# Patient Record
Sex: Male | Born: 1985
Health system: Southern US, Community
[De-identification: ages and names within clinical notes are randomized; demographics above are authoritative.]

## PROBLEM LIST (undated history)

## (undated) ENCOUNTER — Emergency Department (HOSPITAL_COMMUNITY): Disposition: A | Discharge status: Home / Self Care | Payer: Self-pay

## (undated) DIAGNOSIS — Z8719 Personal history of other diseases of the digestive system: Secondary | ICD-10-CM

## (undated) DIAGNOSIS — J45909 Unspecified asthma, uncomplicated: Secondary | ICD-10-CM

---
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
## 2009-11-28 ENCOUNTER — Emergency Department (HOSPITAL_COMMUNITY): Admission: EM | Admit: 2009-11-28 | Discharge: 2009-11-28 | Payer: Self-pay | Admitting: Emergency Medicine

## 2010-04-22 ENCOUNTER — Emergency Department (HOSPITAL_COMMUNITY): Admission: EM | Admit: 2010-04-22 | Discharge: 2010-04-22 | Payer: Self-pay | Admitting: Emergency Medicine

## 2013-01-11 ENCOUNTER — Emergency Department (HOSPITAL_COMMUNITY)
Admission: EM | Admit: 2013-01-11 | Discharge: 2013-01-11 | Disposition: A | Discharge status: Home / Self Care | Payer: Self-pay | Attending: Emergency Medicine | Admitting: Emergency Medicine

## 2013-01-11 ENCOUNTER — Encounter (HOSPITAL_COMMUNITY): Payer: Self-pay | Admitting: Emergency Medicine

## 2013-01-11 DIAGNOSIS — S61409A Unspecified open wound of unspecified hand, initial encounter: Secondary | ICD-10-CM | POA: Insufficient documentation

## 2013-01-11 DIAGNOSIS — F17200 Nicotine dependence, unspecified, uncomplicated: Secondary | ICD-10-CM | POA: Insufficient documentation

## 2013-01-11 DIAGNOSIS — Y929 Unspecified place or not applicable: Secondary | ICD-10-CM | POA: Insufficient documentation

## 2013-01-11 DIAGNOSIS — S61411A Laceration without foreign body of right hand, initial encounter: Secondary | ICD-10-CM

## 2013-01-11 DIAGNOSIS — J45909 Unspecified asthma, uncomplicated: Secondary | ICD-10-CM | POA: Insufficient documentation

## 2013-01-11 DIAGNOSIS — W2209XA Striking against other stationary object, initial encounter: Secondary | ICD-10-CM | POA: Insufficient documentation

## 2013-01-11 DIAGNOSIS — Y9367 Activity, basketball: Secondary | ICD-10-CM | POA: Insufficient documentation

## 2013-01-11 HISTORY — DX: Unspecified asthma, uncomplicated: J45.909

## 2013-01-11 MED ORDER — HYDROCODONE-ACETAMINOPHEN 5-325 MG PO TABS: 1.0000 | ORAL_TABLET | ORAL | Status: DC | PRN

## 2013-01-11 MED ORDER — HYDROCODONE-ACETAMINOPHEN 5-325 MG PO TABS
1.0000 | ORAL_TABLET | Freq: Once | ORAL | Status: AC
  Administered 2013-01-11: 1 via ORAL
  Filled 2013-01-11: qty 1

## 2013-01-11 NOTE — ED Provider Notes (Signed)
History     CSN: [id number]  Arrival date & time 01/11/13  1944   None     Chief Complaint  Patient presents with  . Laceration    (Consider location/radiation/quality/duration/timing/severity/associated sxs/prior treatment) HPI History provided by pt.   Pt was jumping over a fence to retrieve a basketball this afternoon, and sustained a lac to right palm.  Pain is minimal and bleeding has stopped.  No associated paresthesias.  Tetanus is up to date.  Past Medical History  Diagnosis Date  . Asthma     History reviewed. No pertinent past surgical history.  No family history on file.  History  Substance Use Topics  . Smoking status: Current Every Day Smoker  . Smokeless tobacco: Not on file  . Alcohol Use: Yes      Review of Systems  All other systems reviewed and are negative.    Allergies  Review of patient's allergies indicates no known allergies.  Home Medications   Current Outpatient Rx  Name  Route  Sig  Dispense  Refill  . HYDROcodone-acetaminophen (NORCO/VICODIN) 5-325 MG per tablet   Oral   Take 1 tablet by mouth every 4 (four) hours as needed for pain.   12 tablet   0     BP 113/63  Pulse 86  Temp(Src) 98.5 F (36.9 C) (Oral)  Resp 16  SpO2 100%  Physical Exam  Nursing note and vitals reviewed. Constitutional: He is oriented to person, place, and time. He appears well-developed and well-nourished. No distress.  HENT:  Head: Normocephalic and atraumatic.  Eyes:  Normal appearance  Neck: Normal range of motion.  Pulmonary/Chest: Effort normal.  Musculoskeletal: Normal range of motion.  4-4.5cm subq vertical lac that runs from proximal phalanx of 2nd finger to center of palm.  Clean and hemostatic.  Ttp.  Full ROM of all fingers and distal sensation intact.   Neurological: He is alert and oriented to person, place, and time.  Psychiatric: He has a normal mood and affect. His behavior is normal.    ED Course  Procedures (including  critical care time)  LACERATION REPAIR Performed by: Otilio Miu Authorized by: Ruby Cola E Consent: Verbal consent obtained. Risks and benefits: risks, benefits and alternatives were discussed Consent given by: patient Patient identity confirmed: provided demographic data Prepped and Draped in normal sterile fashion Wound explored  Laceration Location: right palm  Laceration Length: 4.5cm  No Foreign Bodies seen or palpated  Anesthesia: local infiltration  Local anesthetic: lidocaine 2% w/out epinephrine  Anesthetic total: 5 ml  Irrigation method: syringe Amount of cleaning: standard  Skin closure: prolene 4.0  Number of sutures: 8  Technique: simple interrupted  Patient tolerance: Patient tolerated the procedure well with no immediate complications.  Labs Reviewed - No data to display No results found.   1. Laceration of right hand, initial encounter       MDM  26yo M presents w/ lac to right palm.  Cleaned by nursing staff and sutured by myself.  Tetanus is up to date.  [facility].  Return precautions discussed.          Otilio Miu, PA-C 01/11/13 2059

## 2013-01-11 NOTE — ED Notes (Signed)
PT. REPORTS ACCIDENTALLY HIT HIS HAND AGAINST METAL FENCE THIS EVENING PRESENTS WITH LACERATION AT RIGHT HAND , PRESSURE DRESSING APPLIED PTA.

## 2013-01-11 NOTE — ED Notes (Signed)
Pt c/o right hand pain, reports he was jumping over a fence when he cut it. Pt has a laceration to right hand, bleeding under control.

## 2013-01-12 NOTE — ED Provider Notes (Signed)
Medical screening examination/treatment/procedure(s) were performed by non-physician practitioner and as supervising physician I was immediately available for consultation/collaboration.  Corena Tilson T Asna Muldrow, MD 01/12/13 1146 

## 2013-01-13 ENCOUNTER — Emergency Department (HOSPITAL_COMMUNITY)
Admission: EM | Admit: 2013-01-13 | Discharge: 2013-01-13 | Disposition: A | Discharge status: Home / Self Care | Payer: Self-pay | Attending: Emergency Medicine | Admitting: Emergency Medicine

## 2013-01-13 ENCOUNTER — Encounter (HOSPITAL_COMMUNITY): Payer: Self-pay | Admitting: Emergency Medicine

## 2013-01-13 DIAGNOSIS — F17200 Nicotine dependence, unspecified, uncomplicated: Secondary | ICD-10-CM | POA: Insufficient documentation

## 2013-01-13 DIAGNOSIS — J45909 Unspecified asthma, uncomplicated: Secondary | ICD-10-CM | POA: Insufficient documentation

## 2013-01-13 DIAGNOSIS — Z4800 Encounter for change or removal of nonsurgical wound dressing: Secondary | ICD-10-CM | POA: Insufficient documentation

## 2013-01-13 DIAGNOSIS — IMO0001 Reserved for inherently not codable concepts without codable children: Secondary | ICD-10-CM

## 2013-01-13 MED ORDER — CEPHALEXIN 500 MG PO CAPS: 500.0000 mg | ORAL_CAPSULE | Freq: Four times a day (QID) | ORAL | Status: DC

## 2013-01-13 NOTE — ED Provider Notes (Signed)
History    This chart was scribed for non-physician practitioner working with Tommy Munch, MD by Tommy Thompson, ED Scribe. This patient was seen in room TR08C/TR08C and the patient's care was started at 1805.   CSN: [id number]  Arrival date & time 01/13/13  1724   First MD Initiated Contact with Patient 01/13/13 1805      Chief Complaint  Patient presents with  . Hand Injury    (Consider location/radiation/quality/duration/timing/severity/associated sxs/prior treatment) Patient is a 27 y.o. male presenting with hand injury. The history is provided by the patient and medical records. No language interpreter was used.  Hand Injury Location:  Hand Time since incident:  2 days Injury: yes   Hand location:  R palm Pain details:    Radiates to:  Does not radiate Chronicity:  New Dislocation: no   Foreign body present:  No foreign bodies Prior injury to area:  No Worsened by:  Movement   Tommy Thompson is a 27 y.o. male who presents to the Emergency Department complaining of a wound check for a sudden onset, constant, moderate laceration to the palmar surface of the left hand that began 2 days ago when he jumped over a fence to retrieve a basketball and caught his hand on the fence. He denies any other symptoms. He states that his tetanus is up to date. He was seen in the ED on 04/02 and had the laceration repaired with 8 sutures. He states that he returned to the ED after increased swelling to the area per his discharge instructions.     Past Medical History  Diagnosis Date  . Asthma     History reviewed. No pertinent past surgical history.  No family history on file.  History  Substance Use Topics  . Smoking status: Current Every Day Smoker -- 0.00 packs/day for 7 years    Types: Cigarettes  . Smokeless tobacco: Not on file  . Alcohol Use: Yes      Review of Systems  Skin: Positive for wound.  All other systems reviewed and are negative.    Allergies  Review  of patient's allergies indicates no known allergies.  Home Medications   Current Outpatient Rx  Name  Route  Sig  Dispense  Refill  . HYDROcodone-acetaminophen (NORCO/VICODIN) 5-325 MG per tablet   Oral   Take 1 tablet by mouth every 4 (four) hours as needed for pain.   12 tablet   0     BP 103/57  Pulse 65  Temp(Src) 97.8 F (36.6 C)  Resp 18  SpO2 99%  Physical Exam  Nursing note and vitals reviewed. Constitutional: He is oriented to person, place, and time. He appears well-developed and well-nourished. No distress.  HENT:  Head: Normocephalic and atraumatic.  Eyes: EOM are normal. Pupils are equal, round, and reactive to light.  Neck: Normal range of motion. Neck supple. No tracheal deviation present.  Cardiovascular: Normal rate.   Pulmonary/Chest: Effort normal. No respiratory distress.  Abdominal: Soft. He exhibits no distension.  Musculoskeletal: Normal range of motion. He exhibits no edema.  Neurological: He is alert and oriented to person, place, and time.  Skin: Skin is warm and dry. Laceration noted.  4.5 cm to the palmar surface of the right palm with 8 sutures in place. The edges are well-approximated. No warmth or redness noted. Minimal swelling.  Psychiatric: He has a normal mood and affect. His behavior is normal.    ED Course  Procedures (including critical care time)  DIAGNOSTIC STUDIES: Oxygen Saturation is 99% on room air, normal by my interpretation.    COORDINATION OF CARE:  18:20- Discussed planned course of treatment with the patient, including cleaning and redressing the wound, who is agreeable at this time.  Labs Reviewed - No data to display No results found.   No diagnosis found.  No evidence of infection.  Distal PMS intact.  Wound cleaned and redressed.  MDM    I personally performed the services described in this documentation, which was scribed in my presence. The recorded information has been reviewed and is  accurate.        Tommy Norman, NP 01/13/13 2340

## 2013-01-13 NOTE — Progress Notes (Signed)
Orthopedic Tech Progress Note Patient Details:  Tommy Thompson [birth year] [id number]  Ortho Devices Type of Ortho Device: Buddy tape;Finger splint Ortho Device/Splint Location: right hand Ortho Device/Splint Interventions: Application   Nikki Dom 01/13/2013, 7:29 PM

## 2013-01-13 NOTE — ED Notes (Signed)
Per patient he was given stitches two nights ago for laceration to anterior hand. Swelling in right hand has increased since then and discharge instructions said to come to ER or UC if pt experienced swelling. No redness or swelling noted to stiches. Edges of skin approximated. Pt able to move all fingers.

## 2013-01-14 NOTE — ED Provider Notes (Signed)
  Medical screening examination/treatment/procedure(s) were performed by non-physician practitioner and as supervising physician I was immediately available for consultation/collaboration.    Fabrice Dyal, MD 01/14/13 0035 

## 2013-04-15 ENCOUNTER — Encounter (HOSPITAL_COMMUNITY): Payer: Self-pay | Admitting: *Deleted

## 2013-04-15 ENCOUNTER — Emergency Department (HOSPITAL_COMMUNITY)
Admission: EM | Admit: 2013-04-15 | Discharge: 2013-04-15 | Disposition: A | Discharge status: Home / Self Care | Payer: Self-pay | Attending: Emergency Medicine | Admitting: Emergency Medicine

## 2013-04-15 ENCOUNTER — Emergency Department (HOSPITAL_COMMUNITY): Payer: Self-pay

## 2013-04-15 DIAGNOSIS — S4980XA Other specified injuries of shoulder and upper arm, unspecified arm, initial encounter: Secondary | ICD-10-CM | POA: Insufficient documentation

## 2013-04-15 DIAGNOSIS — F17200 Nicotine dependence, unspecified, uncomplicated: Secondary | ICD-10-CM | POA: Insufficient documentation

## 2013-04-15 DIAGNOSIS — S40019A Contusion of unspecified shoulder, initial encounter: Secondary | ICD-10-CM | POA: Insufficient documentation

## 2013-04-15 DIAGNOSIS — X58XXXA Exposure to other specified factors, initial encounter: Secondary | ICD-10-CM | POA: Insufficient documentation

## 2013-04-15 DIAGNOSIS — Y929 Unspecified place or not applicable: Secondary | ICD-10-CM | POA: Insufficient documentation

## 2013-04-15 DIAGNOSIS — J45909 Unspecified asthma, uncomplicated: Secondary | ICD-10-CM | POA: Insufficient documentation

## 2013-04-15 DIAGNOSIS — T148XXA Other injury of unspecified body region, initial encounter: Secondary | ICD-10-CM

## 2013-04-15 DIAGNOSIS — M79641 Pain in right hand: Secondary | ICD-10-CM

## 2013-04-15 DIAGNOSIS — M25512 Pain in left shoulder: Secondary | ICD-10-CM

## 2013-04-15 DIAGNOSIS — S46909A Unspecified injury of unspecified muscle, fascia and tendon at shoulder and upper arm level, unspecified arm, initial encounter: Secondary | ICD-10-CM | POA: Insufficient documentation

## 2013-04-15 DIAGNOSIS — Y9389 Activity, other specified: Secondary | ICD-10-CM | POA: Insufficient documentation

## 2013-04-15 DIAGNOSIS — S6990XA Unspecified injury of unspecified wrist, hand and finger(s), initial encounter: Secondary | ICD-10-CM | POA: Insufficient documentation

## 2013-04-15 MED ORDER — IBUPROFEN 800 MG PO TABS: 800.0000 mg | ORAL_TABLET | Freq: Three times a day (TID) | ORAL | Status: DC
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
## 2013-04-15 NOTE — ED Notes (Signed)
Pt transported to XR.  

## 2013-04-15 NOTE — ED Provider Notes (Signed)
Medical screening examination/treatment/procedure(s) were performed by non-physician practitioner and as supervising physician I was immediately available for consultation/collaboration.   Shemekia Patane, MD 04/15/13 2317 

## 2014-08-15 ENCOUNTER — Emergency Department (HOSPITAL_COMMUNITY)
Admission: EM | Admit: 2014-08-15 | Discharge: 2014-08-16 | Disposition: A | Discharge status: Home / Self Care | Payer: Self-pay | Attending: Emergency Medicine | Admitting: Emergency Medicine

## 2014-08-15 ENCOUNTER — Encounter (HOSPITAL_COMMUNITY): Payer: Self-pay | Admitting: *Deleted

## 2014-08-15 DIAGNOSIS — R5381 Other malaise: Secondary | ICD-10-CM

## 2014-08-15 DIAGNOSIS — Z720 Tobacco use: Secondary | ICD-10-CM | POA: Insufficient documentation

## 2014-08-15 DIAGNOSIS — K589 Irritable bowel syndrome without diarrhea: Secondary | ICD-10-CM

## 2014-08-15 DIAGNOSIS — Z791 Long term (current) use of non-steroidal anti-inflammatories (NSAID): Secondary | ICD-10-CM | POA: Insufficient documentation

## 2014-08-15 DIAGNOSIS — R1013 Epigastric pain: Secondary | ICD-10-CM

## 2014-08-15 DIAGNOSIS — J45909 Unspecified asthma, uncomplicated: Secondary | ICD-10-CM | POA: Insufficient documentation

## 2014-08-15 DIAGNOSIS — K580 Irritable bowel syndrome with diarrhea: Secondary | ICD-10-CM | POA: Insufficient documentation

## 2014-08-15 HISTORY — DX: Personal history of other diseases of the digestive system: Z87.19

## 2014-08-15 LAB — URINALYSIS, ROUTINE W REFLEX MICROSCOPIC
Bilirubin Urine: NEGATIVE
GLUCOSE, UA: NEGATIVE mg/dL
Hgb urine dipstick: NEGATIVE
Ketones, ur: NEGATIVE mg/dL
Leukocytes, UA: NEGATIVE
NITRITE: NEGATIVE
PH: 6 (ref 5.0–8.0)
PROTEIN: NEGATIVE mg/dL
SPECIFIC GRAVITY, URINE: 1.02 (ref 1.005–1.030)
Urobilinogen, UA: 0.2 mg/dL (ref 0.0–1.0)

## 2014-08-15 LAB — CBC WITH DIFFERENTIAL/PLATELET
Basophils Absolute: 0 10*3/uL (ref 0.0–0.1)
Basophils Relative: 1 % (ref 0–1)
Eosinophils Absolute: 0.1 10*3/uL (ref 0.0–0.7)
Eosinophils Relative: 1 % (ref 0–5)
HCT: 44.5 % (ref 39.0–52.0)
Hemoglobin: 15.5 g/dL (ref 13.0–17.0)
Lymphocytes Relative: 38 % (ref 12–46)
Lymphs Abs: 2.7 10*3/uL (ref 0.7–4.0)
MCH: 29.4 pg (ref 26.0–34.0)
MCHC: 34.8 g/dL (ref 30.0–36.0)
MCV: 84.4 fL (ref 78.0–100.0)
MONO ABS: 0.6 10*3/uL (ref 0.1–1.0)
MONOS PCT: 9 % (ref 3–12)
NEUTROS ABS: 3.7 10*3/uL (ref 1.7–7.7)
Neutrophils Relative %: 53 % (ref 43–77)
Platelets: 224 10*3/uL (ref 150–400)
RBC: 5.27 MIL/uL (ref 4.22–5.81)
RDW: 12.8 % (ref 11.5–15.5)
WBC: 7.1 10*3/uL (ref 4.0–10.5)

## 2014-08-15 LAB — COMPREHENSIVE METABOLIC PANEL
ALBUMIN: 4.6 g/dL (ref 3.5–5.2)
ALK PHOS: 72 U/L (ref 39–117)
ALT: 29 U/L (ref 0–53)
AST: 32 U/L (ref 0–37)
Anion gap: 12 (ref 5–15)
BUN: 15 mg/dL (ref 6–23)
CALCIUM: 9.3 mg/dL (ref 8.4–10.5)
CO2: 27 meq/L (ref 19–32)
Chloride: 99 mEq/L (ref 96–112)
Creatinine, Ser: 1.05 mg/dL (ref 0.50–1.35)
GFR calc Af Amer: >90 mL/min (ref >90)
Glucose, Bld: 87 mg/dL (ref 70–99)
Potassium: 4.3 mEq/L (ref 3.7–5.3)
Sodium: 138 mEq/L (ref 137–147)
Total Bilirubin: 0.5 mg/dL (ref 0.3–1.2)
Total Protein: 8.2 g/dL (ref 6.0–8.3)

## 2014-08-15 LAB — LIPASE, BLOOD: LIPASE: 23 U/L (ref 11–59)

## 2014-08-15 NOTE — ED Notes (Signed)
The pt is c/o abd pain with chills  Nausea no energy with diarrhea for 2 days  Hx of ibs

## 2014-08-16 MED ORDER — TRAMADOL HCL 50 MG PO TABS: 50.0000 mg | ORAL_TABLET | Freq: Four times a day (QID) | ORAL | Status: DC | PRN

## 2014-08-16 MED ORDER — PROMETHAZINE HCL 25 MG PO TABS: 25.0000 mg | ORAL_TABLET | Freq: Four times a day (QID) | ORAL | Status: DC | PRN

## 2014-08-16 MED ORDER — ONDANSETRON 4 MG PO TBDP
4.0000 mg | ORAL_TABLET | Freq: Once | ORAL | Status: AC
Start: 2014-08-16 — End: 2014-08-16
  Administered 2014-08-16: 4 mg via ORAL
  Filled 2014-08-16: qty 1

## 2014-08-16 MED ORDER — RANITIDINE HCL 150 MG PO TABS: 150.0000 mg | ORAL_TABLET | Freq: Two times a day (BID) | ORAL | Status: DC

## 2014-08-16 NOTE — ED Provider Notes (Signed)
CSN: [id number]     Arrival date & time 08/15/14  1913 History   First MD Initiated Contact with Patient 08/16/14 0008     Chief Complaint  Patient presents with  . Abdominal Pain     (Consider location/radiation/quality/duration/timing/severity/associated sxs/prior Treatment) HPI Comments: Pt comes in with cc of abd pain and chills with malaise. PT has hx of IBS, and the pain is sharp, and similar to his IBS flare up, however, instead of lower abd, it is in the epigastrium. There is no hx of heavy nsaids or alcohol use. + nausea, no emesis. Pt has loose BM x 4 today. No blood. Pt also has generalized weakness and malaise, with chills, no fevers. No uri like sx. + sick contacts at home, all of pt's kids have viral illness. Pt's sx started yday.   Patient is a 28 y.o. male presenting with abdominal pain. The history is provided by the patient.  Abdominal Pain Associated symptoms: chills, diarrhea, fatigue and nausea   Associated symptoms: no chest pain, no cough, no dysuria, no fever and no shortness of breath     Past Medical History  Diagnosis Date  . Asthma   . History of IBS    History reviewed. No pertinent past surgical history. No family history on file. History  Substance Use Topics  . Smoking status: Current Every Day Smoker -- 0.00 packs/day for 7 years    Types: Cigarettes  . Smokeless tobacco: Not on file  . Alcohol Use: Yes    Review of Systems  Constitutional: Positive for chills, activity change and fatigue. Negative for fever and appetite change.  Respiratory: Negative for cough and shortness of breath.   Cardiovascular: Negative for chest pain.  Gastrointestinal: Positive for nausea, abdominal pain and diarrhea.  Genitourinary: Negative for dysuria.  Skin: Negative for rash.  Neurological: Positive for weakness.      Allergies  Review of patient's allergies indicates no known allergies.  Home Medications   Prior to Admission medications    Medication Sig Start Date End Date Taking? Authorizing Provider  ibuprofen (ADVIL,MOTRIN) 800 MG tablet Take 1 tablet (800 mg total) by mouth 3 (three) times daily. 04/15/13   Arthor CaptainAbigail Harris, PA-C  ranitidine (ZANTAC) 150 MG tablet Take 1 tablet (150 mg total) by mouth 2 (two) times daily. 08/16/14   Derwood KaplanAnkit Awesome Jared, MD  traMADol (ULTRAM) 50 MG tablet Take 1 tablet (50 mg total) by mouth every 6 (six) hours as needed. 08/16/14   Kerstyn Coryell Rhunette CroftNanavati, MD   BP 119/71 mmHg  Pulse 57  Temp(Src) 98.9 F (37.2 C) (Oral)  Resp 19  Ht 5\' 10"  (1.778 m)  Wt 160 lb (72.576 kg)  BMI 22.96 kg/m2  SpO2 98% Physical Exam  Constitutional: He is oriented to person, place, and time. He appears well-developed.  HENT:  Head: Normocephalic and atraumatic.  Eyes: Conjunctivae and EOM are normal. Pupils are equal, round, and reactive to light.  Neck: Normal range of motion. Neck supple.  Cardiovascular: Normal rate and regular rhythm.   Pulmonary/Chest: Effort normal and breath sounds normal.  Abdominal: Soft. Bowel sounds are normal. He exhibits no distension. There is no tenderness. There is no rebound and no guarding.  Neurological: He is alert and oriented to person, place, and time.  Skin: Skin is warm.  Nursing note and vitals reviewed.   ED Course  Procedures (including critical care time) Labs Review Labs Reviewed  CBC WITH DIFFERENTIAL  COMPREHENSIVE METABOLIC PANEL  LIPASE, BLOOD  URINALYSIS, ROUTINE  W REFLEX MICROSCOPIC    Imaging Review No results found.   EKG Interpretation None      MDM   Final diagnoses:  IBS (irritable bowel syndrome)  Malaise  Abdominal pain, acute, epigastric    Pt comes in with cc of weakness and abd pain. Abd pain - epigastrium, labs are WNL. Hx of IBS, suspect that pain can be related. There is no dehydration seen.  Malaise, chills - sounds like viral syndrome, though there is no focal symptoms. PO challenge ordered, when passes, will be  discharged.   Derwood KaplanAnkit Rhylen Shaheen, MD 08/16/14 [phone]

## 2014-08-16 NOTE — Discharge Instructions (Signed)
Irritable Bowel Syndrome Irritable bowel syndrome (IBS) is caused by a disturbance of normal bowel function and is a common digestive disorder. You may also hear this condition called spastic colon, mucous colitis, and irritable colon. There is no cure for IBS. However, symptoms often gradually improve or disappear with a good diet, stress management, and medicine. This condition usually appears in late adolescence or early adulthood. Women develop it twice as often as men. CAUSES  After food has been digested and absorbed in the small intestine, waste material is moved into the large intestine, or colon. In the colon, water and salts are absorbed from the undigested products coming from the small intestine. The remaining residue, or fecal material, is held for elimination. Under normal circumstances, gentle, rhythmic contractions of the bowel walls push the fecal material along the colon toward the rectum. In IBS, however, these contractions are irregular and poorly coordinated. The fecal material is either retained too long, resulting in constipation, or expelled too soon, producing diarrhea. SIGNS AND SYMPTOMS  The most common symptom of IBS is abdominal pain. It is often in the lower left side of the abdomen, but it may occur anywhere in the abdomen. The pain comes from spasms of the bowel muscles happening too much and from the buildup of gas and fecal material in the colon. This pain:  Can range from sharp abdominal cramps to a dull, continuous ache.  Often worsens soon after eating.  Is often relieved by having a bowel movement or passing gas. Abdominal pain is usually accompanied by constipation, but it may also produce diarrhea. The diarrhea often occurs right after a meal or upon waking up in the morning. The stools are often soft, watery, and flecked with mucus. Other symptoms of IBS include:  Bloating.  Loss of appetite.  Heartburn.  Backache.  Dull pain in the arms or  shoulders.  Nausea.  Burping.  Vomiting.  Gas. IBS may also cause symptoms that are unrelated to the digestive system, such as:  Fatigue.  Headaches.  Anxiety.  Shortness of breath.  Trouble concentrating.  Dizziness. These symptoms tend to come and go. DIAGNOSIS  The symptoms of IBS may seem like symptoms of other, more serious digestive disorders. Your health care provider may want to perform tests to exclude these disorders.  TREATMENT Many medicines are available to help correct bowel function or relieve bowel spasms and abdominal pain. Among the medicines available are:  Laxatives for severe constipation and to help restore normal bowel habits.  Specific antidiarrheal medicines to treat severe or lasting diarrhea.  Antispasmodic agents to relieve intestinal cramps. Your health care provider may also decide to treat you with a mild tranquilizer or sedative during unusually stressful periods in your life. Your health care provider may also prescribe antidepressant medicine. The use of this medicine has been shown to reduce pain and other symptoms of IBS. Remember that if any medicine is prescribed for you, you should take it exactly as directed. Make sure your health care provider knows how well it worked for you. HOME CARE INSTRUCTIONS   Take all medicines as directed by your health care provider.  Avoid foods that are high in fat or oils, such as heavy cream, butter, frankfurters, sausage, and other fatty meats.  Avoid foods that make you go to the bathroom, such as fruit, fruit juice, and dairy products.  Cut out carbonated drinks, chewing gum, and "gassy" foods such as beans and cabbage. This may help relieve bloating and burping.  Eat foods with bran, and drink plenty of liquids with the bran foods. This helps relieve constipation.  Keep track of what foods seem to bring on your symptoms.  Avoid emotionally charged situations or circumstances that produce  anxiety.  Start or continue exercising.  Get plenty of rest and sleep. Document Released: 09/28/2005 Document Revised: 10/03/2013 Document Reviewed: 05/18/2008 [facility] Patient Information 2015 [place], [place]. This information is not intended to replace advice given to you by your health care provider. Make sure you discuss any questions you have with your health care provider.  Weakness Weakness is a lack of strength. It may be felt all over the body (generalized) or in one specific part of the body (focal). Some causes of weakness can be serious. You may need further medical evaluation, especially if you are elderly or you have a history of immunosuppression (such as chemotherapy or HIV), kidney disease, heart disease, or diabetes. CAUSES  Weakness can be caused by many different things, including:  Infection.  Physical exhaustion.  Internal bleeding or other blood loss that results in a lack of red blood cells (anemia).  Dehydration. This cause is more common in elderly people.  Side effects or electrolyte abnormalities from medicines, such as pain medicines or sedatives.  Emotional distress, anxiety, or depression.  Circulation problems, especially severe peripheral arterial disease.  Heart disease, such as rapid atrial fibrillation, bradycardia, or heart failure.  Nervous system disorders, such as Guillain-Barr syndrome, multiple sclerosis, or stroke. DIAGNOSIS  To find the cause of your weakness, your caregiver will take your history and perform a physical exam. Lab tests or X-rays may also be ordered, if needed. TREATMENT  Treatment of weakness depends on the cause of your symptoms and can vary greatly. HOME CARE INSTRUCTIONS   Rest as needed.  Eat a well-balanced diet.  Try to get some exercise every day.  Only take over-the-counter or prescription medicines as directed by your caregiver. SEEK MEDICAL CARE IF:   Your weakness seems to be getting worse or spreads  to other parts of your body.  You develop new aches or pains. SEEK IMMEDIATE MEDICAL CARE IF:   You cannot perform your normal daily activities, such as getting dressed and feeding yourself.  You cannot walk up and down stairs, or you feel exhausted when you do so.  You have shortness of breath or chest pain.  You have difficulty moving parts of your body.  You have weakness in only one area of the body or on only one side of the body.  You have a fever.  You have trouble speaking or swallowing.  You cannot control your bladder or bowel movements.  You have black or bloody vomit or stools. MAKE SURE YOU:  Understand these instructions.  Will watch your condition.  Will get help right away if you are not doing well or get worse. Document Released: 09/28/2005 Document Revised: 03/29/2012 Document Reviewed: 11/27/2011 [facility]Exit[facility] Patient Information 2015 [place], [place]. This information is not intended to replace advice given to you by your health care provider. Make sure you discuss any questions you have with your health care provider.

## 2014-08-16 NOTE — ED Notes (Signed)
Fluid challenge started. 

## 2014-09-03 ENCOUNTER — Encounter (HOSPITAL_COMMUNITY): Payer: Self-pay | Admitting: *Deleted

## 2014-09-03 ENCOUNTER — Emergency Department (HOSPITAL_COMMUNITY)
Admission: EM | Admit: 2014-09-03 | Discharge: 2014-09-04 | Disposition: A | Discharge status: Home / Self Care | Payer: Self-pay | Attending: Emergency Medicine | Admitting: Emergency Medicine

## 2014-09-03 DIAGNOSIS — J45909 Unspecified asthma, uncomplicated: Secondary | ICD-10-CM | POA: Insufficient documentation

## 2014-09-03 DIAGNOSIS — Z8719 Personal history of other diseases of the digestive system: Secondary | ICD-10-CM | POA: Insufficient documentation

## 2014-09-03 DIAGNOSIS — Z720 Tobacco use: Secondary | ICD-10-CM | POA: Insufficient documentation

## 2014-09-03 DIAGNOSIS — R55 Syncope and collapse: Secondary | ICD-10-CM | POA: Insufficient documentation

## 2014-09-03 DIAGNOSIS — Z79899 Other long term (current) drug therapy: Secondary | ICD-10-CM | POA: Insufficient documentation

## 2014-09-03 LAB — I-STAT CHEM 8, ED
BUN: 6 mg/dL (ref 6–23)
Calcium, Ion: 1.13 mmol/L (ref 1.12–1.23)
Chloride: 101 mEq/L (ref 96–112)
Creatinine, Ser: 1.1 mg/dL (ref 0.50–1.35)
Glucose, Bld: 103 mg/dL — ABNORMAL HIGH (ref 70–99)
HCT: 46 % (ref 39.0–52.0)
Hemoglobin: 15.6 g/dL (ref 13.0–17.0)
Potassium: 3.9 mEq/L (ref 3.7–5.3)
Sodium: 141 mEq/L (ref 137–147)
TCO2: 30 mmol/L (ref 0–100)

## 2014-09-03 LAB — CBG MONITORING, ED: Glucose-Capillary: 115 mg/dL — ABNORMAL HIGH (ref 70–99)

## 2014-09-03 NOTE — ED Notes (Signed)
Pt reports while at work he experienced a witnessed syncopal episode while at work, unsure of length of LOC - states he is unsure of cause however did go an extended period of time w/o eating. Pt admits to some photophobia at this time. Pt denies any recent illness.

## 2014-09-04 LAB — CBC
HEMATOCRIT: 41.7 % (ref 39.0–52.0)
Hemoglobin: 14.4 g/dL (ref 13.0–17.0)
MCH: 29.7 pg (ref 26.0–34.0)
MCHC: 34.5 g/dL (ref 30.0–36.0)
MCV: 86 fL (ref 78.0–100.0)
Platelets: 221 10*3/uL (ref 150–400)
RBC: 4.85 MIL/uL (ref 4.22–5.81)
RDW: 13 % (ref 11.5–15.5)
WBC: 6.2 10*3/uL (ref 4.0–10.5)

## 2014-09-04 LAB — URINALYSIS, ROUTINE W REFLEX MICROSCOPIC
Bilirubin Urine: NEGATIVE
Glucose, UA: NEGATIVE mg/dL
Hgb urine dipstick: NEGATIVE
Ketones, ur: NEGATIVE mg/dL
LEUKOCYTES UA: NEGATIVE
NITRITE: NEGATIVE
PH: 6 (ref 5.0–8.0)
Protein, ur: NEGATIVE mg/dL
SPECIFIC GRAVITY, URINE: 1.026 (ref 1.005–1.030)
UROBILINOGEN UA: 1 mg/dL (ref 0.0–1.0)

## 2014-09-04 LAB — CBG MONITORING, ED: GLUCOSE-CAPILLARY: 124 mg/dL — AB (ref 70–99)

## 2014-09-04 NOTE — Discharge Instructions (Signed)
Make sure you are eating small frequent meals during the day and drinking plenty of water.  Return to the ER for worsening condition or new concerning symptoms.   Syncope Syncope is a medical term for fainting or passing out. This means you lose consciousness and drop to the ground. People are generally unconscious for less than 5 minutes. You may have some muscle twitches for up to 15 seconds before waking up and returning to normal. Syncope occurs more often in older adults, but it can happen to anyone. While most causes of syncope are not dangerous, syncope can be a sign of a serious medical problem. It is important to seek medical care.  CAUSES  Syncope is caused by a sudden drop in blood flow to the brain. The specific cause is often not determined. Factors that can bring on syncope include:  Taking medicines that lower blood pressure.  Sudden changes in posture, such as standing up quickly.  Taking more medicine than prescribed.  Standing in one place for too long.  Seizure disorders.  Dehydration and excessive exposure to heat.  Low blood sugar (hypoglycemia).  Straining to have a bowel movement.  Heart disease, irregular heartbeat, or other circulatory problems.  Fear, emotional distress, seeing blood, or severe pain. SYMPTOMS  Right before fainting, you may:  Feel dizzy or light-headed.  Feel nauseous.  See all white or all black in your field of vision.  Have cold, clammy skin. DIAGNOSIS  Your health care provider will ask about your symptoms, perform a physical exam, and perform an electrocardiogram (ECG) to record the electrical activity of your heart. Your health care provider may also perform other heart or blood tests to determine the cause of your syncope which may include:  Transthoracic echocardiogram (TTE). During echocardiography, sound waves are used to evaluate how blood flows through your heart.  Transesophageal echocardiogram (TEE).  Cardiac  monitoring. This allows your health care provider to monitor your heart rate and rhythm in real time.  Holter monitor. This is a portable device that records your heartbeat and can help diagnose heart arrhythmias. It allows your health care provider to track your heart activity for several days, if needed.  Stress tests by exercise or by giving medicine that makes the heart beat faster. TREATMENT  In most cases, no treatment is needed. Depending on the cause of your syncope, your health care provider may recommend changing or stopping some of your medicines. HOME CARE INSTRUCTIONS  Have someone stay with you until you feel stable.  Do not drive, use machinery, or play sports until your health care provider says it is okay.  Keep all follow-up appointments as directed by your health care provider.  Lie down right away if you start feeling like you might faint. Breathe deeply and steadily. Wait until all the symptoms have passed.  Drink enough fluids to keep your urine clear or pale yellow.  If you are taking blood pressure or heart medicine, get up slowly and take several minutes to sit and then stand. This can reduce dizziness. SEEK IMMEDIATE MEDICAL CARE IF:   You have a severe headache.  You have unusual pain in the chest, abdomen, or back.  You are bleeding from your mouth or rectum, or you have black or tarry stool.  You have an irregular or very fast heartbeat.  You have pain with breathing.  You have repeated fainting or seizure-like jerking during an episode.  You faint when sitting or lying down.  You have  confusion.  You have trouble walking.  You have severe weakness.  You have vision problems. If you fainted, call your local emergency services (911 in [place] Do not drive yourself to the hospital.  MAKE SURE YOU:  Understand these instructions.  Will watch your condition.  Will get help right away if you are not doing well or get worse. Document Released:  09/28/2005 Document Revised: 10/03/2013 Document Reviewed: 11/27/2011 [facility] Patient Information 2015 [employer] [place]. This information is not intended to replace advice given to you by your health care provider. Make sure you discuss any questions you have with your health care provider.

## 2014-09-04 NOTE — ED Provider Notes (Signed)
CSN: [id number]     Arrival date & time 09/03/14  2209 History   First MD Initiated Contact with Patient 09/03/14 2356     Chief Complaint  Patient presents with  . Loss of Consciousness     (Consider location/radiation/quality/duration/timing/severity/associated sxs/prior Treatment) HPI 28 year old male presents to emergency department after syncopal event at work.  Patient reports he was standing at work having a pre-shift meeting when he began to feel hot, dizzy, and unwell.  Patient reports he tried to walk around and stumbled into someone else who caught him prior to falling.  Patient reports that he did not have breakfast or lunch, had some chicken and.  Better crackers on the way into work.  He denies any chest pain, shortness of breath, headache, dizziness, nausea, vomiting, diarrhea.  No prior history of syncope.  He has history of IBS and asthma.  Neither are currently active. Past Medical History  Diagnosis Date  . Asthma   . History of IBS    History reviewed. No pertinent past surgical history. History reviewed. No pertinent family history. History  Substance Use Topics  . Smoking status: Current Every Day Smoker -- 0.50 packs/day for 7 years    Types: Cigarettes  . Smokeless tobacco: Not on file  . Alcohol Use: Yes    Review of Systems  See History of Present Illness; otherwise all other systems are reviewed and negative   Allergies  Review of patient's allergies indicates no known allergies.  Home Medications   Prior to Admission medications   Medication Sig Start Date End Date Taking? Authorizing Provider  promethazine (PHENERGAN) 25 MG tablet Take 1 tablet (25 mg total) by mouth every 6 (six) hours as needed for nausea. 08/16/14  Yes Derwood KaplanAnkit Nanavati, MD  ranitidine (ZANTAC) 150 MG tablet Take 1 tablet (150 mg total) by mouth 2 (two) times daily. 08/16/14  Yes Derwood KaplanAnkit Nanavati, MD  traMADol (ULTRAM) 50 MG tablet Take 1 tablet (50 mg total) by mouth every 6 (six)  hours as needed. 08/16/14  Yes Derwood KaplanAnkit Nanavati, MD  ibuprofen (ADVIL,MOTRIN) 800 MG tablet Take 1 tablet (800 mg total) by mouth 3 (three) times daily. 04/15/13   Abigail Harris, PA-C   BP 98/58 mmHg  Pulse 66  Temp(Src) 98 F (36.7 C) (Oral)  Resp 18  Ht 5\' 10"  (1.778 m)  Wt 170 lb (77.111 kg)  BMI 24.39 kg/m2  SpO2 98% Physical Exam  Constitutional: He is oriented to person, place, and time. He appears well-developed and well-nourished. No distress.  HENT:  Head: Normocephalic and atraumatic.  Nose: Nose normal.  Mouth/Throat: Oropharynx is clear and moist.  Eyes: Conjunctivae and EOM are normal. Pupils are equal, round, and reactive to light.  Neck: Normal range of motion. Neck supple. No JVD present. No tracheal deviation present. No thyromegaly present.  Cardiovascular: Normal rate, regular rhythm, normal heart sounds and intact distal pulses.  Exam reveals no gallop and no friction rub.   No murmur heard. Pulmonary/Chest: Effort normal and breath sounds normal. No stridor. No respiratory distress. He has no wheezes. He has no rales. He exhibits no tenderness.  Abdominal: Soft. Bowel sounds are normal. He exhibits no distension and no mass. There is no tenderness. There is no rebound and no guarding.  Musculoskeletal: Normal range of motion. He exhibits no edema or tenderness.  Lymphadenopathy:    He has no cervical adenopathy.  Neurological: He is alert and oriented to person, place, and time. He displays normal reflexes. He exhibits  normal muscle tone. Coordination normal.  Skin: Skin is warm and dry. No rash noted. No erythema. No pallor.  Psychiatric: He has a normal mood and affect. His behavior is normal. Judgment and thought content normal.  Nursing note and vitals reviewed.   ED Course  Procedures (including critical care time) Labs Review Labs Reviewed  CBG MONITORING, ED - Abnormal; Notable for the following:    Glucose-Capillary 115 (*)    All other components  within normal limits  I-STAT CHEM 8, ED - Abnormal; Notable for the following:    Glucose, Bld 103 (*)    All other components within normal limits  CBG MONITORING, ED - Abnormal; Notable for the following:    Glucose-Capillary 124 (*)    All other components within normal limits  CBC    Imaging Review No results found.   EKG Interpretation   Date/Time:  Monday September 03 2014 22:33:22 EST Ventricular Rate:  61 PR Interval:  140 QRS Duration: 88 QT Interval:  366 QTC Calculation: 368 R Axis:   93 Text Interpretation:  Normal sinus rhythm with sinus arrhythmia Rightward  axis Anterior infarct , age undetermined Abnormal ECG No old tracing to  compare Confirmed by Charleston Hankin  MD, Upton Russey ([id number]) on 09/04/2014 12:03:07 AM      MDM   Final diagnoses:  Syncope, unspecified syncope type    28 year old male with syncopal episode.  EKG without prolonged QT, early repulse seen.  No other abnormalities.  Patient admits to having poor diet today.  Orthostatic vital signs show low BP, but not significantly orthostatic.  Will have patient hydrate orally, check urine, and plan to send home.    Olivia Mackielga M Gottlieb Zuercher, MD 09/04/14 [phone]

## 2014-09-04 NOTE — ED Notes (Signed)
Offered patient water and given urinal for urine sample

## 2014-12-28 ENCOUNTER — Emergency Department (HOSPITAL_COMMUNITY): Payer: Self-pay

## 2014-12-28 ENCOUNTER — Emergency Department (HOSPITAL_COMMUNITY)
Admission: EM | Admit: 2014-12-28 | Discharge: 2014-12-28 | Disposition: A | Discharge status: Home / Self Care | Payer: Self-pay | Attending: Emergency Medicine | Admitting: Emergency Medicine

## 2014-12-28 ENCOUNTER — Encounter (HOSPITAL_COMMUNITY): Payer: Self-pay | Admitting: *Deleted

## 2014-12-28 DIAGNOSIS — J45909 Unspecified asthma, uncomplicated: Secondary | ICD-10-CM | POA: Insufficient documentation

## 2014-12-28 DIAGNOSIS — R110 Nausea: Secondary | ICD-10-CM | POA: Insufficient documentation

## 2014-12-28 DIAGNOSIS — R1031 Right lower quadrant pain: Secondary | ICD-10-CM | POA: Insufficient documentation

## 2014-12-28 DIAGNOSIS — Z8719 Personal history of other diseases of the digestive system: Secondary | ICD-10-CM | POA: Insufficient documentation

## 2014-12-28 DIAGNOSIS — Z720 Tobacco use: Secondary | ICD-10-CM | POA: Insufficient documentation

## 2014-12-28 LAB — URINALYSIS, ROUTINE W REFLEX MICROSCOPIC
Bilirubin Urine: NEGATIVE
Glucose, UA: NEGATIVE mg/dL
HGB URINE DIPSTICK: NEGATIVE
KETONES UR: NEGATIVE mg/dL
Leukocytes, UA: NEGATIVE
NITRITE: NEGATIVE
PH: 6 (ref 5.0–8.0)
Protein, ur: NEGATIVE mg/dL
SPECIFIC GRAVITY, URINE: 1.027 (ref 1.005–1.030)
UROBILINOGEN UA: 0.2 mg/dL (ref 0.0–1.0)

## 2014-12-28 LAB — CBC WITH DIFFERENTIAL/PLATELET
BASOS PCT: 0 % (ref 0–1)
Basophils Absolute: 0 10*3/uL (ref 0.0–0.1)
EOS PCT: 1 % (ref 0–5)
Eosinophils Absolute: 0.1 10*3/uL (ref 0.0–0.7)
HEMATOCRIT: 40.2 % (ref 39.0–52.0)
HEMOGLOBIN: 14 g/dL (ref 13.0–17.0)
Lymphocytes Relative: 55 % — ABNORMAL HIGH (ref 12–46)
Lymphs Abs: 3.5 10*3/uL (ref 0.7–4.0)
MCH: 29.9 pg (ref 26.0–34.0)
MCHC: 34.8 g/dL (ref 30.0–36.0)
MCV: 85.9 fL (ref 78.0–100.0)
MONO ABS: 0.5 10*3/uL (ref 0.1–1.0)
MONOS PCT: 8 % (ref 3–12)
NEUTROS ABS: 2.4 10*3/uL (ref 1.7–7.7)
Neutrophils Relative %: 36 % — ABNORMAL LOW (ref 43–77)
Platelets: 217 10*3/uL (ref 150–400)
RBC: 4.68 MIL/uL (ref 4.22–5.81)
RDW: 12.7 % (ref 11.5–15.5)
WBC: 6.5 10*3/uL (ref 4.0–10.5)

## 2014-12-28 LAB — COMPREHENSIVE METABOLIC PANEL
ALBUMIN: 4.3 g/dL (ref 3.5–5.2)
ALK PHOS: 51 U/L (ref 39–117)
ALT: 22 U/L (ref 0–53)
AST: 24 U/L (ref 0–37)
Anion gap: 9 (ref 5–15)
BUN: 10 mg/dL (ref 6–23)
CO2: 26 mmol/L (ref 19–32)
Calcium: 9.3 mg/dL (ref 8.4–10.5)
Chloride: 103 mmol/L (ref 96–112)
Creatinine, Ser: 1.14 mg/dL (ref 0.50–1.35)
GFR calc Af Amer: >90 mL/min (ref >90)
GFR calc non Af Amer: 88 mL/min — ABNORMAL LOW (ref >90)
Glucose, Bld: 88 mg/dL (ref 70–99)
Potassium: 3.6 mmol/L (ref 3.5–5.1)
Sodium: 138 mmol/L (ref 135–145)
TOTAL PROTEIN: 6.6 g/dL (ref 6.0–8.3)
Total Bilirubin: 0.8 mg/dL (ref 0.3–1.2)

## 2014-12-28 LAB — LIPASE, BLOOD: Lipase: 20 U/L (ref 11–59)

## 2014-12-28 MED ORDER — HYDROCODONE-ACETAMINOPHEN 5-325 MG PO TABS: 1.0000 | ORAL_TABLET | Freq: Four times a day (QID) | ORAL | Status: DC | PRN

## 2014-12-28 MED ORDER — MORPHINE SULFATE 4 MG/ML IJ SOLN
4.0000 mg | Freq: Once | INTRAMUSCULAR | Status: DC
  Filled 2014-12-28: qty 1

## 2014-12-28 MED ORDER — FENTANYL CITRATE 0.05 MG/ML IJ SOLN
50.0000 ug | Freq: Once | INTRAMUSCULAR | Status: AC
  Administered 2014-12-28: 50 ug via INTRAVENOUS
  Filled 2014-12-28: qty 2

## 2014-12-28 MED ORDER — IOHEXOL 300 MG/ML  SOLN
25.0000 mL | Freq: Once | INTRAMUSCULAR | Status: AC | PRN
  Administered 2014-12-28: 25 mL via ORAL

## 2014-12-28 MED ORDER — SODIUM CHLORIDE 0.9 % IV BOLUS (SEPSIS)
1000.0000 mL | Freq: Once | INTRAVENOUS | Status: AC
  Administered 2014-12-28: 1000 mL via INTRAVENOUS

## 2014-12-28 MED ORDER — ONDANSETRON HCL 4 MG/2ML IJ SOLN
4.0000 mg | Freq: Once | INTRAMUSCULAR | Status: AC
  Administered 2014-12-28: 4 mg via INTRAVENOUS
  Filled 2014-12-28: qty 2

## 2014-12-28 MED ORDER — IOHEXOL 300 MG/ML  SOLN
100.0000 mL | Freq: Once | INTRAMUSCULAR | Status: AC | PRN
  Administered 2014-12-28: 100 mL via INTRAVENOUS

## 2014-12-28 NOTE — ED Notes (Signed)
Dr. Silverio LayYao is at the bedside.

## 2014-12-28 NOTE — ED Notes (Signed)
The pt is c/o lower abd pain since yesterday with nv no diarrhea

## 2014-12-28 NOTE — ED Provider Notes (Signed)
CSN: [id number]     Arrival date & time 12/28/14  1701 History   First MD Initiated Contact with Patient 12/28/14 1943     Chief Complaint  Patient presents with  . Abdominal Pain     (Consider location/radiation/quality/duration/timing/severity/associated sxs/prior Treatment) The history is provided by the patient.  Tommy Thompson is a 29 y.o. male hx of IBS, asthma here with right lower quadrant pain. Right lower quadrant pain since yesterday. Associated with some nausea but no vomiting. Denies any fevers. Denies any urinary symptoms. He does have a history of IBS and took his tramadol with minimal relief. Denies any previous abdominal surgeries.    Past Medical History  Diagnosis Date  . Asthma   . History of IBS    History reviewed. No pertinent past surgical history. No family history on file. History  Substance Use Topics  . Smoking status: Current Every Day Smoker -- 0.50 packs/day for 7 years    Types: Cigarettes  . Smokeless tobacco: Not on file  . Alcohol Use: Yes    Review of Systems  Gastrointestinal: Positive for abdominal pain.  All other systems reviewed and are negative.     Allergies  Review of patient's allergies indicates no known allergies.  Home Medications   Prior to Admission medications   Medication Sig Start Date End Date Taking? Authorizing Provider  ibuprofen (ADVIL,MOTRIN) 800 MG tablet Take 1 tablet (800 mg total) by mouth 3 (three) times daily. Patient not taking: Reported on 12/28/2014 04/15/13   Arthor CaptainAbigail Harris, PA-C  promethazine (PHENERGAN) 25 MG tablet Take 1 tablet (25 mg total) by mouth every 6 (six) hours as needed for nausea. Patient not taking: Reported on 12/28/2014 08/16/14   Derwood KaplanAnkit Nanavati, MD  ranitidine (ZANTAC) 150 MG tablet Take 1 tablet (150 mg total) by mouth 2 (two) times daily. Patient not taking: Reported on 12/28/2014 08/16/14   Derwood KaplanAnkit Nanavati, MD  traMADol (ULTRAM) 50 MG tablet Take 1 tablet (50 mg total) by mouth every 6  (six) hours as needed. Patient not taking: Reported on 12/28/2014 08/16/14   Derwood KaplanAnkit Nanavati, MD   BP 106/58 mmHg  Pulse 49  Temp(Src) 97.8 F (36.6 C) (Oral)  Resp 20  Ht 5' 10.5" (1.791 m)  Wt 157 lb 1 oz (71.243 kg)  BMI 22.21 kg/m2  SpO2 95% Physical Exam  Constitutional: He is oriented to person, place, and time. He appears well-developed and well-nourished.  Uncomfortable   HENT:  Head: Normocephalic.  Mouth/Throat: Oropharynx is clear and moist.  Eyes: Conjunctivae are normal. Pupils are equal, round, and reactive to light.  Neck: Normal range of motion. Neck supple.  Cardiovascular: Normal rate, regular rhythm and normal heart sounds.   Pulmonary/Chest: Effort normal and breath sounds normal.  Abdominal: Soft. Bowel sounds are normal.  RLQ tenderness, mild rebound. No CVAT   Genitourinary:  Testicles nontender   Musculoskeletal: Normal range of motion.  Neurological: He is alert and oriented to person, place, and time. No cranial nerve deficit. Coordination normal.  Skin: Skin is warm and dry.  Psychiatric: He has a normal mood and affect. His behavior is normal. Judgment and thought content normal.  Nursing note and vitals reviewed.   ED Course  Procedures (including critical care time) Labs Review Labs Reviewed  CBC WITH DIFFERENTIAL/PLATELET - Abnormal; Notable for the following:    Neutrophils Relative % 36 (*)    Lymphocytes Relative 55 (*)    All other components within normal limits  COMPREHENSIVE METABOLIC PANEL -  Abnormal; Notable for the following:    GFR calc non Af Amer 88 (*)    All other components within normal limits  LIPASE, BLOOD  URINALYSIS, ROUTINE W REFLEX MICROSCOPIC    Imaging Review Ct Abdomen Pelvis W Contrast  12/28/2014   CLINICAL DATA:  Right upper quadrant pain with nausea and vomiting, 1 day duration.  EXAM: CT ABDOMEN AND PELVIS WITH CONTRAST  TECHNIQUE: Multidetector CT imaging of the abdomen and pelvis was performed using the  standard protocol following bolus administration of intravenous contrast.  CONTRAST:  OMNIPAQUE IOHEXOL 300 MG/ML  SOLN  COMPARISON:  None.  FINDINGS: There are normal appearances of the liver, spleen, pancreas, adrenals and kidneys. Gallbladder and bile ducts appear normal. Bowel and mesentery appear unremarkable. No acute inflammatory changes are evident in the abdomen or pelvis. There is no significant abnormality in the lower chest. There is no significant musculoskeletal abnormality.  IMPRESSION: No significant abnormality   Electronically Signed   By: Ellery Plunk M.D.   On: 12/28/2014 21:43     EKG Interpretation None      MDM   Final diagnoses:  RLQ abdominal pain    KELDEN LAVALLEE is a 29 y.o. male here with RLQ pain. Will need to r/o appy. Also consider renal colic. Will get labs, UA, CT ab/pel.   10:35 PM CT unremarkable. Likely gastro. UA nl. Will dc home with vicodin prn.    Richardean Canal, MD 12/28/14 [phone]

## 2014-12-28 NOTE — Discharge Instructions (Signed)
Take motrin for pain.   Take vicodin for severe pain. Do NOT drive with it.   Follow up with your doctor.  Return to ER if you have severe pain, vomiting, fever.

## 2014-12-28 NOTE — ED Notes (Signed)
paitent currently drinking contrast.

## 2014-12-28 NOTE — ED Notes (Signed)
Discussed plan of care with Dr. Silverio LayYao, provided ginger ale as PO challenge.

## 2015-03-05 ENCOUNTER — Encounter (HOSPITAL_COMMUNITY): Payer: Self-pay | Admitting: Emergency Medicine

## 2015-03-05 ENCOUNTER — Emergency Department (HOSPITAL_COMMUNITY)
Admission: EM | Admit: 2015-03-05 | Discharge: 2015-03-06 | Disposition: A | Discharge status: Home / Self Care | Payer: Self-pay | Attending: Emergency Medicine | Admitting: Emergency Medicine

## 2015-03-05 DIAGNOSIS — X58XXXA Exposure to other specified factors, initial encounter: Secondary | ICD-10-CM | POA: Insufficient documentation

## 2015-03-05 DIAGNOSIS — J45909 Unspecified asthma, uncomplicated: Secondary | ICD-10-CM | POA: Insufficient documentation

## 2015-03-05 DIAGNOSIS — S39011A Strain of muscle, fascia and tendon of abdomen, initial encounter: Secondary | ICD-10-CM | POA: Insufficient documentation

## 2015-03-05 DIAGNOSIS — Y998 Other external cause status: Secondary | ICD-10-CM | POA: Insufficient documentation

## 2015-03-05 DIAGNOSIS — R591 Generalized enlarged lymph nodes: Secondary | ICD-10-CM | POA: Insufficient documentation

## 2015-03-05 DIAGNOSIS — Y9289 Other specified places as the place of occurrence of the external cause: Secondary | ICD-10-CM | POA: Insufficient documentation

## 2015-03-05 DIAGNOSIS — Y9389 Activity, other specified: Secondary | ICD-10-CM | POA: Insufficient documentation

## 2015-03-05 DIAGNOSIS — R590 Localized enlarged lymph nodes: Secondary | ICD-10-CM

## 2015-03-05 DIAGNOSIS — Z8719 Personal history of other diseases of the digestive system: Secondary | ICD-10-CM | POA: Insufficient documentation

## 2015-03-05 DIAGNOSIS — Z720 Tobacco use: Secondary | ICD-10-CM | POA: Insufficient documentation

## 2015-03-05 LAB — CBC WITH DIFFERENTIAL/PLATELET
Basophils Absolute: 0 10*3/uL (ref 0.0–0.1)
Basophils Relative: 1 % (ref 0–1)
Eosinophils Absolute: 0.2 10*3/uL (ref 0.0–0.7)
Eosinophils Relative: 4 % (ref 0–5)
HEMATOCRIT: 39.2 % (ref 39.0–52.0)
Hemoglobin: 13.7 g/dL (ref 13.0–17.0)
Lymphocytes Relative: 53 % — ABNORMAL HIGH (ref 12–46)
Lymphs Abs: 2.9 10*3/uL (ref 0.7–4.0)
MCH: 29.5 pg (ref 26.0–34.0)
MCHC: 34.9 g/dL (ref 30.0–36.0)
MCV: 84.3 fL (ref 78.0–100.0)
MONO ABS: 0.3 10*3/uL (ref 0.1–1.0)
Monocytes Relative: 6 % (ref 3–12)
NEUTROS ABS: 1.9 10*3/uL (ref 1.7–7.7)
NEUTROS PCT: 36 % — AB (ref 43–77)
Platelets: 206 10*3/uL (ref 150–400)
RBC: 4.65 MIL/uL (ref 4.22–5.81)
RDW: 12.4 % (ref 11.5–15.5)
WBC: 5.3 10*3/uL (ref 4.0–10.5)

## 2015-03-05 LAB — COMPREHENSIVE METABOLIC PANEL
ALT: 27 U/L (ref 17–63)
ANION GAP: 9 (ref 5–15)
AST: 30 U/L (ref 15–41)
Albumin: 4.3 g/dL (ref 3.5–5.0)
Alkaline Phosphatase: 59 U/L (ref 38–126)
BUN: 11 mg/dL (ref 6–20)
CALCIUM: 9.1 mg/dL (ref 8.9–10.3)
CO2: 26 mmol/L (ref 22–32)
Chloride: 105 mmol/L (ref 101–111)
Creatinine, Ser: 1.16 mg/dL (ref 0.61–1.24)
GFR calc Af Amer: >60 mL/min (ref >60)
GFR calc non Af Amer: >60 mL/min (ref >60)
Glucose, Bld: 108 mg/dL — ABNORMAL HIGH (ref 65–99)
Potassium: 3.7 mmol/L (ref 3.5–5.1)
Sodium: 140 mmol/L (ref 135–145)
Total Bilirubin: 0.4 mg/dL (ref 0.3–1.2)
Total Protein: 6.9 g/dL (ref 6.5–8.1)

## 2015-03-05 MED ORDER — KETOROLAC TROMETHAMINE 60 MG/2ML IM SOLN
60.0000 mg | Freq: Once | INTRAMUSCULAR | Status: AC
  Administered 2015-03-06: 60 mg via INTRAMUSCULAR
  Filled 2015-03-05: qty 2

## 2015-03-05 MED ORDER — CEPHALEXIN 250 MG PO CAPS
500.0000 mg | ORAL_CAPSULE | Freq: Once | ORAL | Status: AC
  Administered 2015-03-06: 500 mg via ORAL
  Filled 2015-03-05: qty 2

## 2015-03-05 NOTE — ED Notes (Signed)
The patient said he has been seen here before for abdominal pain.  He says it has gotten worse and he thiniks it is a hernia.  He says he feels like a "strain" there when he lifts anything.  He rates his pain 10/10.  He denies any urinary symptoms or defecating problems.

## 2015-03-05 NOTE — ED Provider Notes (Signed)
CSN: [id number]     Arrival date & time 03/05/15  2119 History   First MD Initiated Contact with Patient 03/05/15 2318     Chief Complaint  Patient presents with  . Hernia    The patient said he has been seen here before for the same issue.  He says it has gotten worse and he thiniks it is a hernia.  He says he feels like a "strain" there when he lifts anything.     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 He presents approximately 24 hours after developing acute onset of suprapubic pain while at his job. His is persistent, worse with lifting heavy objects, worse with trying to change position such as doing a sit up. There is no associated swelling on the right but there is a small amount of swelling in the left groin. He denies fevers chills nausea vomiting diarrhea or discharge from the urethra, he does have constipation. The symptoms are mild to moderate at this time, no associated abdominal surgery in the past. Denies risk factors or history of STDs  The history is provided by the patient.    Past Medical History  Diagnosis Date  . Asthma   . History of IBS    History reviewed. No pertinent past surgical history. History reviewed. No pertinent family history. History  Substance Use Topics  . Smoking status: Current Every Day Smoker -- 0.50 packs/day for 7 years    Types: Cigarettes  . Smokeless tobacco: Not on file  . Alcohol Use: Yes    Review of Systems  All other systems reviewed and are negative.     Allergies  Review of patient's allergies indicates no known allergies.  Home Medications   Prior to Admission medications   Medication Sig Start Date End Date Taking? Authorizing Provider  cephALEXin (KEFLEX) 500 MG capsule Take 1 capsule (500 mg total) by mouth 4 (four) times daily. 03/06/15   Eber Hong, MD  HYDROcodone-acetaminophen (NORCO/VICODIN) 5-325 MG per tablet Take 1 tablet by mouth every 6 (six) hours as needed. Patient not taking: Reported on 03/05/2015 12/28/14   Richardean Canal, MD  naproxen (NAPROSYN) 500 MG tablet Take 1 tablet (500 mg total) by mouth 2 (two) times daily with a meal. 03/06/15   Eber Hong, MD   BP 110/69 mmHg  Pulse 60  Temp(Src) 98.4 F (36.9 C) (Oral)  Resp 20  SpO2 98% Physical Exam  Constitutional: He appears well-developed and well-nourished. No distress.  HENT:  Head: Normocephalic and atraumatic.  Mouth/Throat: Oropharynx is clear and moist. No oropharyngeal exudate.  Eyes: Conjunctivae and EOM are normal. Pupils are equal, round, and reactive to light. Right eye exhibits no discharge. Left eye exhibits no discharge. No scleral icterus.  Neck: Normal range of motion. Neck supple. No JVD present. No thyromegaly present.  Cardiovascular: Normal rate, regular rhythm, normal heart sounds and intact distal pulses.  Exam reveals no gallop and no friction rub.   No murmur heard. Pulmonary/Chest: Effort normal and breath sounds normal. No respiratory distress. He has no wheezes. He has no rales.  Abdominal: Soft. Bowel sounds are normal. He exhibits no distension and no mass. There is tenderness (tenderness in the suprapubic area over the rectus abdominis midline).  Genitourinary:  Normal appearing penis testicles and scrotum, no discharge from the urethral meatus, no hernias present, left inguinal lymphadenopathy is present, one single subcentimeter  mobile rubbery tender lymph node is present without overlying erythema or induration.  Musculoskeletal: Normal range of motion. He exhibits no edema or tenderness.  Lymphadenopathy:    He has no cervical adenopathy.  Neurological: He is alert. Coordination normal.  Skin: Skin is warm and dry. No rash noted. No erythema.  Psychiatric: He has a normal mood and affect. His behavior is normal.  Nursing note and  vitals reviewed.   ED Course  Procedures (including critical care time) Labs Review Labs Reviewed  CBC WITH DIFFERENTIAL/PLATELET - Abnormal; Notable for the following:    Neutrophils Relative % 36 (*)    Lymphocytes Relative 53 (*)    All other components within normal limits  COMPREHENSIVE METABOLIC PANEL - Abnormal; Notable for the following:    Glucose, Bld 108 (*)    All other components within normal limits    Imaging Review No results found.    MDM   Final diagnoses:  Abdominal wall strain, initial encounter  Inguinal lymphadenopathy    The patient is well-appearing, he likely has a rectus abdominis sheath strain, has lymphadenopathy in the left groin which is unaccounted for, he doesn't have any signs of infection, no dysuria, no discharge and no skin infections. We'll treat with Keflex, the patient is aware that he needs to follow-up closely should this not resolve.  Meds given in ED:  Medications  cephALEXin (KEFLEX) capsule 500 mg (not administered)  ketorolac (TORADOL) injection 60 mg (not administered)    New Prescriptions   CEPHALEXIN (KEFLEX) 500 MG CAPSULE    Take 1 capsule (500 mg total) by mouth 4 (four) times daily.   NAPROXEN (NAPROSYN) 500 MG TABLET    Take 1 tablet (500 mg total) by mouth 2 (two) times daily with a meal.        Eber HongBrian Skylee Baird, MD 03/06/15 [phone]

## 2015-03-06 MED ORDER — CEPHALEXIN 500 MG PO CAPS: 500.0000 mg | ORAL_CAPSULE | Freq: Four times a day (QID) | ORAL | Status: DC

## 2015-03-06 MED ORDER — NAPROXEN 500 MG PO TABS: 500.0000 mg | ORAL_TABLET | Freq: Two times a day (BID) | ORAL | Status: DC

## 2015-03-06 NOTE — Discharge Instructions (Signed)
Please call your doctor for a followup appointment within 24-48 hours. When you talk to your doctor please let them know that you were seen in the emergency department and have them acquire all of your records so that they can discuss the findings with you and formulate a treatment plan to fully care for your new and ongoing problems. ° °

## 2016-03-14 IMAGING — CT CT ABD-PELV W/ CM
2 of 4 series · 17 of 46 positions shown, 19 images · IV contrast (Omni 300)
Comparison: None.

CLINICAL DATA: Right upper quadrant pain with nausea and vomiting,
1 day duration.

EXAM:
CT ABDOMEN AND PELVIS WITH CONTRAST
TECHNIQUE: Multidetector CT imaging of the abdomen and pelvis was performed
using the standard protocol following bolus administration of
intravenous contrast.
CONTRAST:  100mL OMNIPAQUE IOHEXOL 300 MG/ML  SOLN

[Series 2: abd/ pelvis 5.0 i30f 1 · axial · 0.68mm/px · z∈[-1126,-716]mm · 14 of 90 slices shown, 16 images]
[im 4/90  soft-tissue]
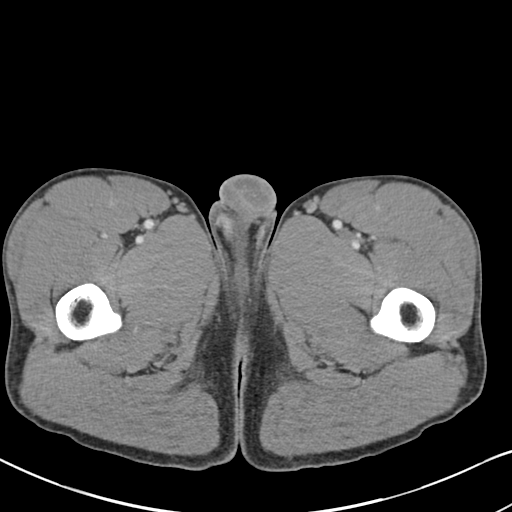
[im 4/90  bone]
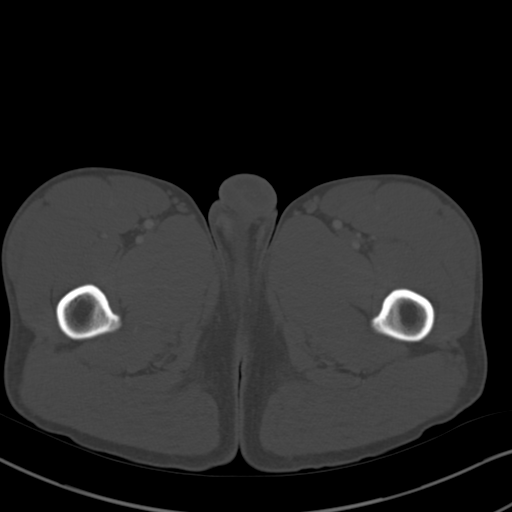
[im 11/90  soft-tissue]
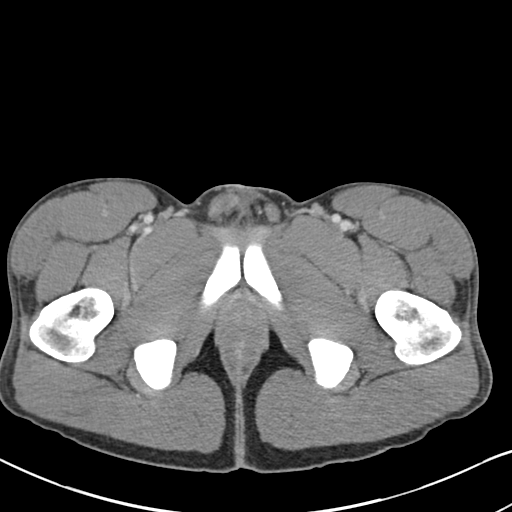
[im 18/90  soft-tissue]
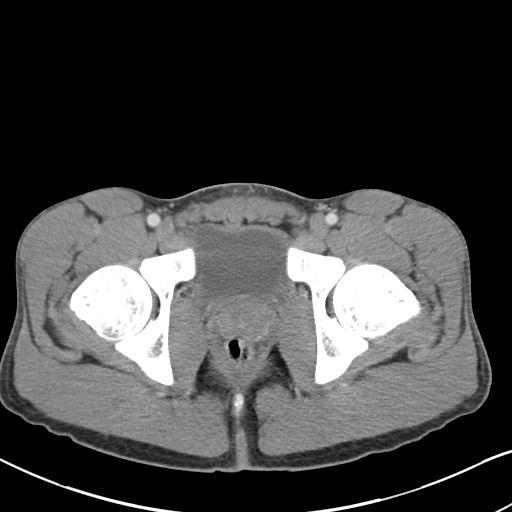
[im 25/90  soft-tissue]
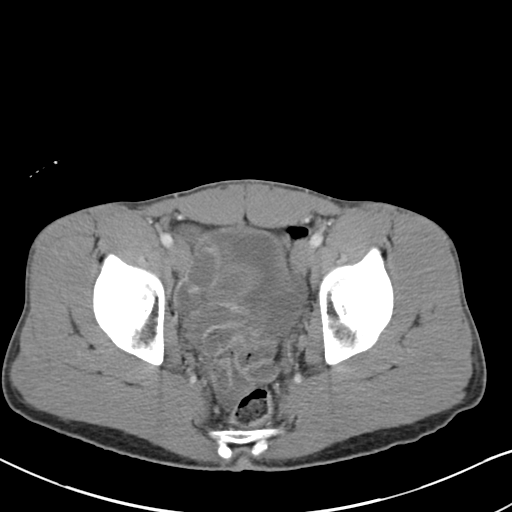
[im 29/90  soft-tissue]
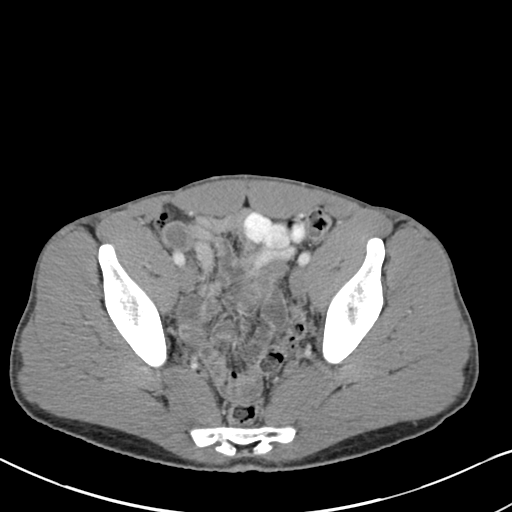
[im 36/90  soft-tissue]
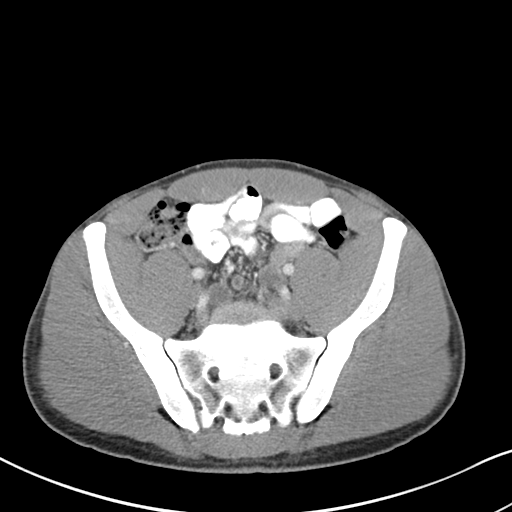
[im 43/90  soft-tissue]
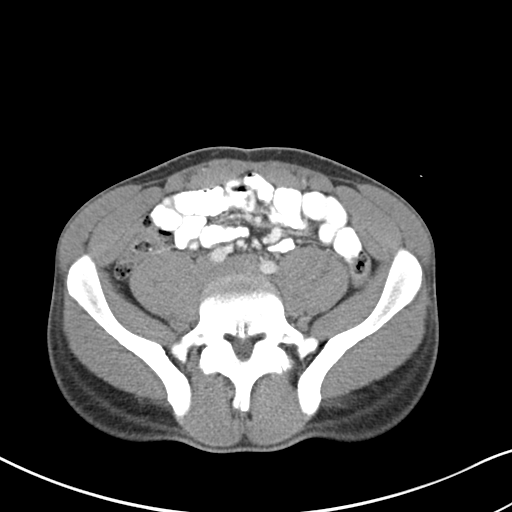
[im 47/90  soft-tissue]
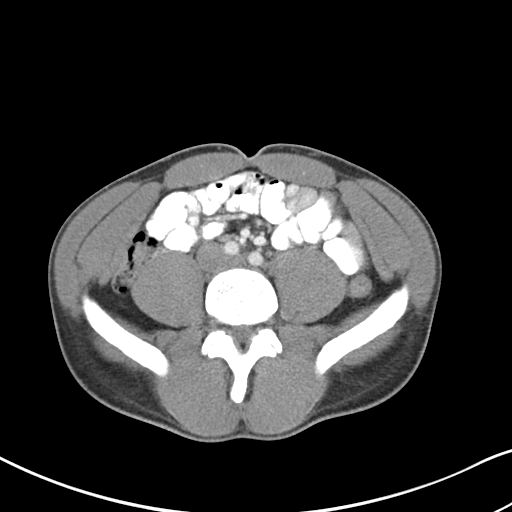
[im 54/90  soft-tissue]
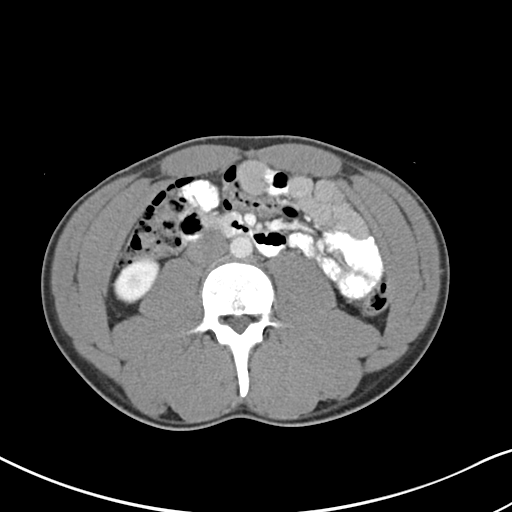
[im 54/90  bone]
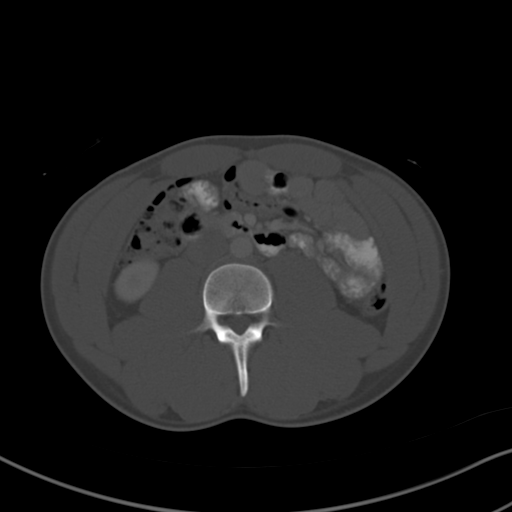
[im 61/90  soft-tissue]
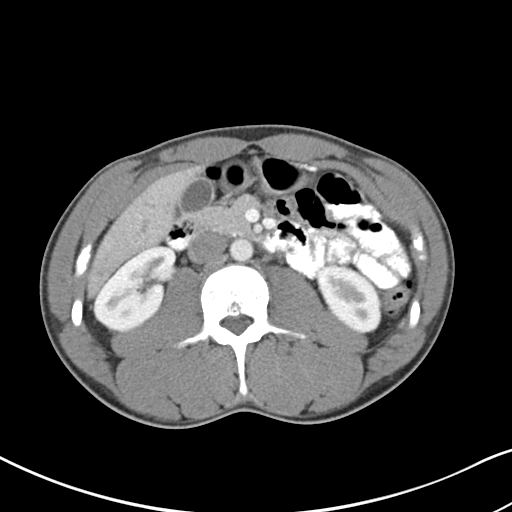
[im 68/90  soft-tissue]
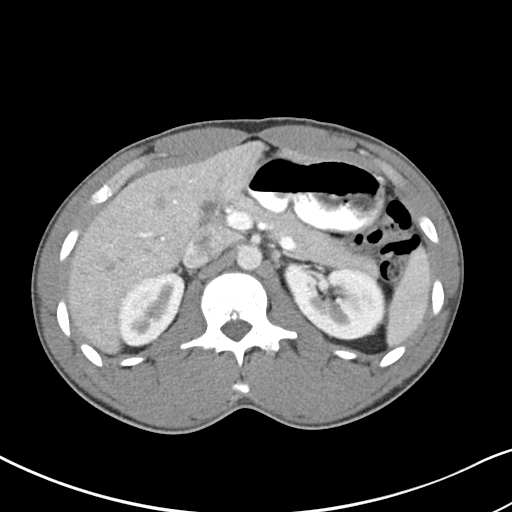
[im 72/90  soft-tissue]
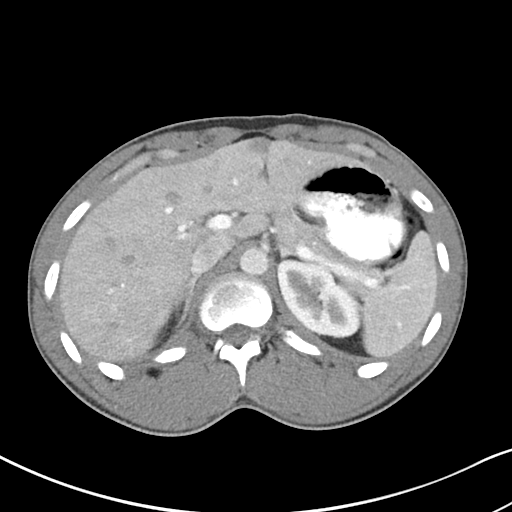
[im 79/90  soft-tissue]
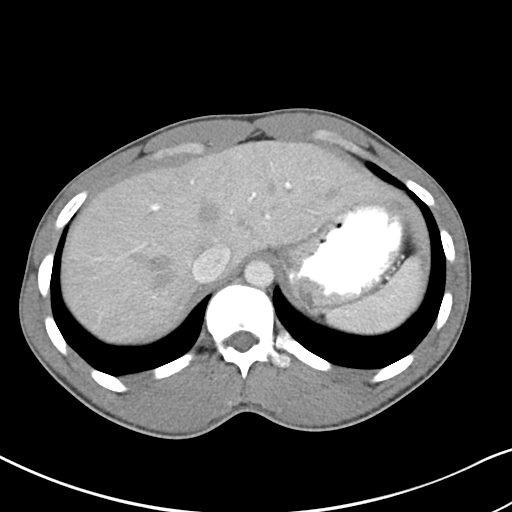
[im 86/90  soft-tissue]
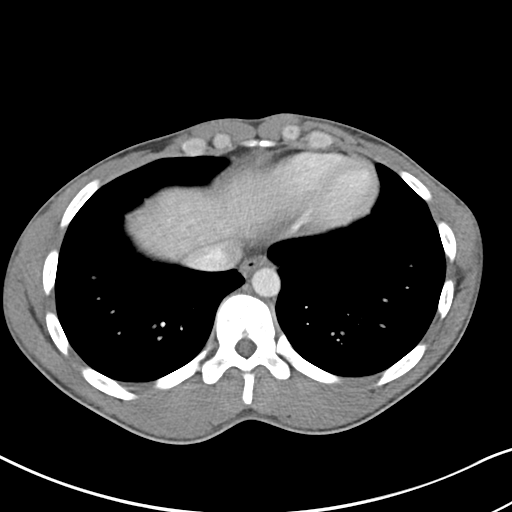

[Series 5: coronals · coronal · 0.70mm/px · 3 of 118 slices shown]
[im 40/118  soft-tissue]
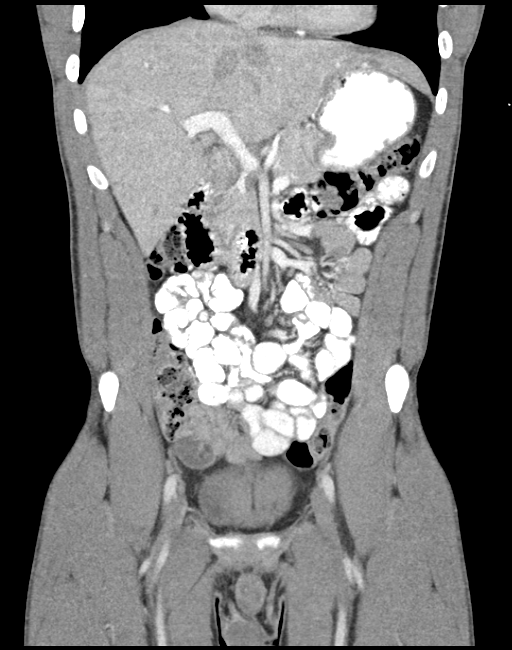
[im 53/118  soft-tissue]
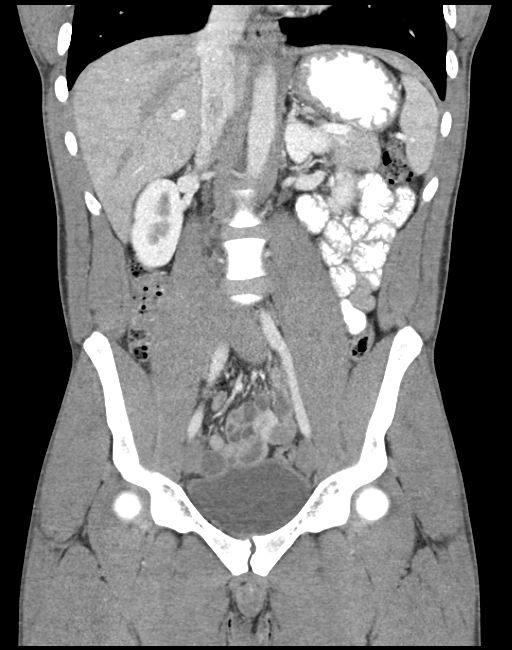
[im 66/118  soft-tissue]
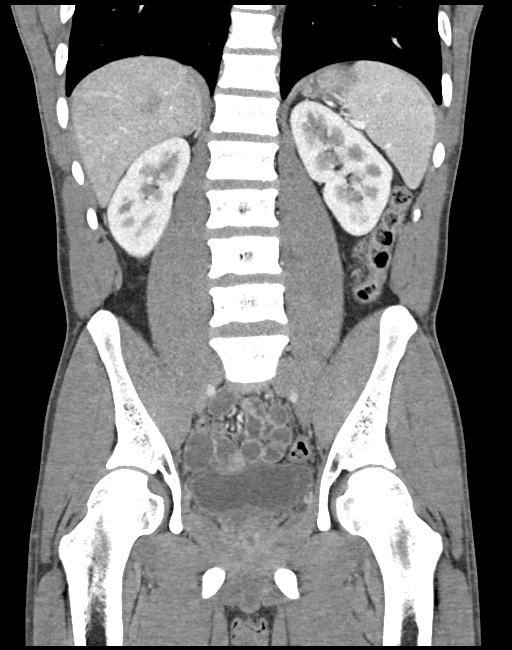

[17 of 46 positions shown; findings below may reference images not displayed]

FINDINGS: There are normal appearances of the liver, spleen, pancreas,
adrenals and kidneys. Gallbladder and bile ducts appear normal.
Bowel and mesentery appear unremarkable. No acute inflammatory
changes are evident in the abdomen or pelvis. There is no
significant abnormality in the lower chest. There is no significant
musculoskeletal abnormality.
IMPRESSION: No significant abnormality

## 2016-04-21 ENCOUNTER — Emergency Department (HOSPITAL_COMMUNITY)
Admission: EM | Admit: 2016-04-21 | Discharge: 2016-04-21 | Disposition: A | Discharge status: Home / Self Care | Payer: Self-pay | Attending: Emergency Medicine | Admitting: Emergency Medicine

## 2016-04-21 ENCOUNTER — Encounter (HOSPITAL_BASED_OUTPATIENT_CLINIC_OR_DEPARTMENT_OTHER): Payer: Self-pay | Admitting: Emergency Medicine

## 2016-04-21 ENCOUNTER — Encounter (HOSPITAL_COMMUNITY): Payer: Self-pay | Admitting: Emergency Medicine

## 2016-04-21 DIAGNOSIS — Y990 Civilian activity done for income or pay: Secondary | ICD-10-CM | POA: Insufficient documentation

## 2016-04-21 DIAGNOSIS — F17210 Nicotine dependence, cigarettes, uncomplicated: Secondary | ICD-10-CM | POA: Insufficient documentation

## 2016-04-21 DIAGNOSIS — J45909 Unspecified asthma, uncomplicated: Secondary | ICD-10-CM | POA: Insufficient documentation

## 2016-04-21 DIAGNOSIS — X500XXA Overexertion from strenuous movement or load, initial encounter: Secondary | ICD-10-CM | POA: Insufficient documentation

## 2016-04-21 DIAGNOSIS — S39011A Strain of muscle, fascia and tendon of abdomen, initial encounter: Secondary | ICD-10-CM | POA: Insufficient documentation

## 2016-04-21 DIAGNOSIS — Y939 Activity, unspecified: Secondary | ICD-10-CM | POA: Insufficient documentation

## 2016-04-21 DIAGNOSIS — Y929 Unspecified place or not applicable: Secondary | ICD-10-CM | POA: Insufficient documentation

## 2016-04-21 MED ORDER — IBUPROFEN 800 MG PO TABS
800.0000 mg | ORAL_TABLET | Freq: Once | ORAL | Status: AC
  Administered 2016-04-21: 800 mg via ORAL
  Filled 2016-04-21: qty 1

## 2016-04-21 MED ORDER — HYDROCODONE-ACETAMINOPHEN 5-325 MG PO TABS: 1.0000 | ORAL_TABLET | Freq: Four times a day (QID) | ORAL | Status: DC | PRN

## 2016-04-21 MED ORDER — HYDROCODONE-ACETAMINOPHEN 5-325 MG PO TABS
2.0000 | ORAL_TABLET | Freq: Once | ORAL | Status: AC
  Administered 2016-04-21: 2 via ORAL
  Filled 2016-04-21: qty 2

## 2016-04-21 MED ORDER — IBUPROFEN 800 MG PO TABS: 800.0000 mg | ORAL_TABLET | Freq: Three times a day (TID) | ORAL | Status: DC | PRN

## 2016-04-21 NOTE — Discharge Instructions (Signed)
Muscle Strain A muscle strain is an injury that occurs when a muscle is stretched beyond its normal length. Usually a small number of muscle fibers are torn when this happens. Muscle strain is rated in degrees. First-degree strains have the least amount of muscle fiber tearing and pain. Second-degree and third-degree strains have increasingly more tearing and pain.  Usually, recovery from muscle strain takes 1-2 weeks. Complete healing takes 5-6 weeks.  CAUSES  Muscle strain happens when a sudden, violent force placed on a muscle stretches it too far. This may occur with lifting, sports, or a fall.  RISK FACTORS Muscle strain is especially common in athletes.  SIGNS AND SYMPTOMS At the site of the muscle strain, there may be:  Pain.  Bruising.  Swelling.  Difficulty using the muscle due to pain or lack of normal function. DIAGNOSIS  Your health care provider will perform a physical exam and ask about your medical history. TREATMENT  Often, the best treatment for a muscle strain is resting, icing, and applying cold compresses to the injured area.  HOME CARE INSTRUCTIONS   Use the PRICE method of treatment to promote muscle healing during the first 2-3 days after your injury. The PRICE method involves:  Protecting the muscle from being injured again.  Restricting your activity and resting the injured body part.  Icing your injury. To do this, put ice in a plastic bag. Place a towel between your skin and the bag. Then, apply the ice and leave it on from 15-20 minutes each hour. After the third day, switch to moist heat packs.  Apply compression to the injured area with a splint or elastic bandage. Be careful not to wrap it too tightly. This may interfere with blood circulation or increase swelling.  Elevate the injured body part above the level of your heart as often as you can.  Only take over-the-counter or prescription medicines for pain, discomfort, or fever as directed by your  health care provider.  Warming up prior to exercise helps to prevent future muscle strains. SEEK MEDICAL CARE IF:   You have increasing pain or swelling in the injured area.  You have numbness, tingling, or a significant loss of strength in the injured area. MAKE SURE YOU:   Understand these instructions.  Will watch your condition.  Will get help right away if you are not doing well or get worse.   This information is not intended to replace advice given to you by your health care provider. Make sure you discuss any questions you have with your health care provider.   Document Released: 09/28/2005 Document Revised: 07/19/2013 Document Reviewed: 04/27/2013 Elsevier Interactive Patient Education [employer]2016 Elsevier Inc.    To find a primary care or specialty doctor please call [phone] or [phone] to access "[place] Find a Doctor Service."  You may also go on the [facility] website at [email or web address]  There are also multiple [facility] and [facility] practices throughout the [place] that are frequently accepting new patients. You may find a clinic that is close to your home and contact them.  [facility] -  [address] [place] [id number] [phone]  [facility] [place] (also locations in [place] and [place]) -  [address] [place] [address] [phone]  [facility] -  [address] [place] [address] [phone]

## 2016-04-21 NOTE — ED Notes (Signed)
Per pt, sudden groin pain after moving furniture at work yesterday.

## 2016-04-21 NOTE — ED Provider Notes (Signed)
TIME SEEN: 5:30 AM  CHIEF COMPLAINT: Right-sided abdominal pain  HPI: Pt is a 30 y.o. male with history of asthma, IBS who presents to the emergency department with right-sided abdominal pain that started after lifting heavy furniture 2 days ago. States that he spoke with his coworkers who made him more a that this could be a hernia and that is what brings him to the emergency department. He denies seeing any bulging in his abdomen or groin region. Reports pain is mostly in the right lower abdomen and radiates into the groin. Pain is worse with movement and palpation. Has never had similar symptoms. Denies fevers, chills, nausea, vomiting, diarrhea, bloody stools or melena, dysuria or hematuria, urinary frequency or urgency, penile discharge, testicular pain or swelling. No history of abdominal surgery.  ROS: See HPI Constitutional: no fever  Eyes: no drainage  ENT: no runny nose   Cardiovascular:  no chest pain  Resp: no SOB  GI: no vomiting GU: no dysuria Integumentary: no rash  Allergy: no hives  Musculoskeletal: no leg swelling  Neurological: no slurred speech ROS otherwise negative  PAST MEDICAL HISTORY/PAST SURGICAL HISTORY:  Past Medical History  Diagnosis Date  . Asthma   . History of IBS     MEDICATIONS:  Prior to Admission medications   Medication Sig Start Date End Date Taking? Authorizing Provider  cephALEXin (KEFLEX) 500 MG capsule Take 1 capsule (500 mg total) by mouth 4 (four) times daily. 03/06/15   Eber HongBrian Miller, MD  HYDROcodone-acetaminophen (NORCO/VICODIN) 5-325 MG per tablet Take 1 tablet by mouth every 6 (six) hours as needed. Patient not taking: Reported on 03/05/2015 12/28/14   Richardean Canalavid H Yao, MD  naproxen (NAPROSYN) 500 MG tablet Take 1 tablet (500 mg total) by mouth 2 (two) times daily with a meal. 03/06/15   Eber HongBrian Miller, MD    ALLERGIES:  No Known Allergies  SOCIAL HISTORY:  Social History  Substance Use Topics  . Smoking status: Current Every Day Smoker  -- 0.50 packs/day for 7 years    Types: Cigarettes  . Smokeless tobacco: Not on file  . Alcohol Use: Yes    FAMILY HISTORY: No family history on file.  EXAM: BP 113/65 mmHg  Pulse 74  Temp(Src) 97.8 F (36.6 C) (Oral)  Resp 16  SpO2 97% CONSTITUTIONAL: Alert and oriented and responds appropriately to questions. Well-appearing; well-nourished, Afebrile, nontoxic HEAD: Normocephalic EYES: Conjunctivae clear, PERRL ENT: normal nose; no rhinorrhea; moist mucous membranes NECK: Supple, no meningismus, no LAD  CARD: RRR; S1 and S2 appreciated; no murmurs, no clicks, no rubs, no gallops RESP: Normal chest excursion without splinting or tachypnea; breath sounds clear and equal bilaterally; no wheezes, no rhonchi, no rales, no hypoxia or respiratory distress, speaking full sentences ABD/GI: Normal bowel sounds; non-distended; soft, minimally tender throughout the right lower abdomen, no rebound, no guarding, no peritoneal signs, no hernias or masses appreciated on exam GU:  Normal external genitalia, circumcised male, normal penile shaft, no blood or discharge at the urethral meatus, no testicular masses or tenderness on exam, no scrotal masses or swelling, no hernias appreciated, 2+ femoral pulses bilaterally; no perineal erythema, warmth, subcutaneous air or crepitus; no high riding testicle, normal bilateral cremasteric reflex.  Chaperone present for exam. BACK:  The back appears normal and is non-tender to palpation, there is no CVA tenderness EXT: Normal ROM in all joints; non-tender to palpation; no edema; normal capillary refill; no cyanosis, no calf tenderness or swelling    SKIN: Normal color for  age and race; warm; no rash NEURO: Moves all extremities equally, sensation to light touch intact diffusely, cranial nerves II through XII intact PSYCH: The patient's mood and manner are appropriate. Grooming and personal hygiene are appropriate.  MEDICAL DECISION MAKING: Patient here with  complaints of right-sided abdominal pain. Sudden onset after lifting something heavy. No hernia appreciated and exam. Normal GU exam. Doubt torsion, epididymitis. He has no other symptoms to suggest appendicitis, colitis, bowel obstruction, UTI, kidney stone. Pain is worse with movement and palpation. Suspect this is an abdominal muscle strain. I feel he is safe to be discharged. Have advised him not to lift anything heavy for the next 3-5 days. We'll discharge with ibuprofen, Vicodin and to take as needed. Discussed with him return precautions. I have given him outpatient PCP follow-up information. I do not feel this time he needs emergent imaging. He verbalizes understanding and is comfortable with this plan.   At this time, I do not feel there is any life-threatening condition present. I have reviewed and discussed all results (EKG, imaging, lab, urine as appropriate), exam findings with patient. I have reviewed nursing notes and appropriate previous records.  I feel the patient is safe to be discharged home without further emergent workup. Discussed usual and customary return precautions. Patient and family (if present) verbalize understanding and are comfortable with this plan.  Patient will follow-up with their primary care provider. If they do not have a primary care provider, information for follow-up has been provided to them. All questions have been answered.     Layla Maw Kyree Fedorko, DO 04/21/16 [phone]

## 2016-10-30 ENCOUNTER — Emergency Department (HOSPITAL_COMMUNITY)
Admission: EM | Admit: 2016-10-30 | Discharge: 2016-10-30 | Disposition: A | Discharge status: Home / Self Care | Payer: Managed Care, Other (non HMO) | Attending: Emergency Medicine | Admitting: Emergency Medicine

## 2016-10-30 ENCOUNTER — Encounter (HOSPITAL_COMMUNITY): Payer: Self-pay

## 2016-10-30 DIAGNOSIS — R05 Cough: Secondary | ICD-10-CM | POA: Diagnosis not present

## 2016-10-30 DIAGNOSIS — J45909 Unspecified asthma, uncomplicated: Secondary | ICD-10-CM | POA: Diagnosis not present

## 2016-10-30 DIAGNOSIS — R69 Illness, unspecified: Secondary | ICD-10-CM

## 2016-10-30 DIAGNOSIS — F17210 Nicotine dependence, cigarettes, uncomplicated: Secondary | ICD-10-CM | POA: Diagnosis not present

## 2016-10-30 DIAGNOSIS — Z79899 Other long term (current) drug therapy: Secondary | ICD-10-CM | POA: Diagnosis not present

## 2016-10-30 DIAGNOSIS — R509 Fever, unspecified: Secondary | ICD-10-CM | POA: Diagnosis present

## 2016-10-30 DIAGNOSIS — J111 Influenza due to unidentified influenza virus with other respiratory manifestations: Secondary | ICD-10-CM

## 2016-10-30 MED ORDER — IBUPROFEN 800 MG PO TABS: 800.0000 mg | ORAL_TABLET | Freq: Once | ORAL | Status: DC

## 2016-10-30 NOTE — ED Triage Notes (Addendum)
Pt. Has flu-like symptoms, fever, chills, nausea sore throat, body aches and productive cough   Pt. s family has the same. nask placed on pt. In triage

## 2016-10-30 NOTE — ED Notes (Signed)
Warm blanket given

## 2016-10-30 NOTE — ED Provider Notes (Signed)
[facility]-EMERGENCY DEPT Provider Note   CSN: [id number] Arrival date & time: 10/30/16  1214     History   Chief Complaint Chief Complaint  Patient presents with  . Cough  . Nausea    HPI RECARDO LINN is a 31 y.o. male.  HPI  31year-old male presents today complaining of nasal congestion, cough, sore throat, and subjective fever for the past 2 and half days. He states that her symptoms began with sore throat. He has not had dyspnea, nausea, vomiting, or diarrhea. He has not taken his temperature. Been taking over-the-counter cough and flu medication the last being taken last night. He stayed home from work yesterday.He did not have his influenza shot. He states his 48-year-old nephew has also been ill. Nephew is in the same household.  Past Medical History:  Diagnosis Date  . Asthma   . History of IBS     There are no active problems to display for this patient.   History reviewed. No pertinent surgical history.     Home Medications    Prior to Admission medications   Medication Sig Start Date End Date Taking? Authorizing Provider  HYDROcodone-acetaminophen (NORCO/VICODIN) 5-325 MG tablet Take 1-2 tablets by mouth every 6 (six) hours as needed. 04/21/16   Kristen N Ward, DO  ibuprofen (ADVIL,MOTRIN) 800 MG tablet Take 1 tablet (800 mg total) by mouth every 8 (eight) hours as needed for mild pain. 04/21/16   Layla Maw Ward, DO    Family History History reviewed. No pertinent family history.  Social History Social History  Substance Use Topics  . Smoking status: Current Every Day Smoker    Packs/day: 0.50    Years: 7.00    Types: Cigarettes  . Smokeless tobacco: Never Used  . Alcohol use Yes     Allergies   Patient has no known allergies.   Review of Systems Review of Systems  All other systems reviewed and are negative.    Physical Exam Updated Vital Signs BP 124/79   Pulse 91   Temp 98.2 F (36.8 C) (Oral)   Resp 18   Ht 5\' 10"  (1.778 m)   Wt  77.1 kg   SpO2 99%   BMI 24.39 kg/m   Physical Exam  Constitutional: He is oriented to person, place, and time. He appears well-developed and well-nourished.  HENT:  Head: Normocephalic and atraumatic.  Right Ear: External ear normal.  Left Ear: External ear normal.  Nose: Nose normal.  Mouth/Throat: Oropharynx is clear and moist.  Eyes: Conjunctivae and EOM are normal. Pupils are equal, round, and reactive to light.  Neck: Normal range of motion. Neck supple.  Cardiovascular: Normal rate, regular rhythm, normal heart sounds and intact distal pulses.   Pulmonary/Chest: Effort normal and breath sounds normal.  Abdominal: Soft. Bowel sounds are normal.  Musculoskeletal: Normal range of motion.  Neurological: He is alert and oriented to person, place, and time. He has normal reflexes.  Skin: Skin is warm and dry.  Psychiatric: He has a normal mood and affect. His behavior is normal. Judgment and thought content normal.  Nursing note and vitals reviewed.    ED Treatments / Results  Labs (all labs ordered are listed, but only abnormal results are displayed) Labs Reviewed - No data to display  EKG  EKG Interpretation None       Radiology No results found.  Procedures Procedures (including critical care time)  Medications Ordered in ED Medications  ibuprofen (ADVIL,MOTRIN) tablet 800 mg (not administered)  Initial Impression / Assessment and Plan / ED Course  I have reviewed the triage vital signs and the nursing notes.  Pertinent labs & imaging results that were available during my care of the patient were reviewed by me and considered in my medical decision making (see chart for details).     31 year old male with influenza-like illness. Presents today with normal vital signs, no difficulty breathing, and taking by mouth. He is currently afebrile. He has continued to smoke to this illness. We have discussed smoking cessation, fever control with acetaminophen and  ibuprofen, as well as hydration. We have also discussed age get a flu shot when he is well again. He voices understanding and he is given a work note until Monday. We discussed that he should not return to work until he has been afebrile without antipyretics for 24 hours.  Final Clinical Impressions(s) / ED Diagnoses   Final diagnoses:  Influenza-like illness    New Prescriptions New Prescriptions   No medications on file     Margarita Grizzleanielle Aldrich Lloyd, MD 10/30/16 1405

## 2016-10-31 ENCOUNTER — Emergency Department (HOSPITAL_COMMUNITY)
Admission: EM | Admit: 2016-10-31 | Discharge: 2016-11-01 | Disposition: A | Discharge status: Home / Self Care | Payer: Managed Care, Other (non HMO) | Attending: Emergency Medicine | Admitting: Emergency Medicine

## 2016-10-31 ENCOUNTER — Encounter (HOSPITAL_COMMUNITY): Payer: Self-pay

## 2016-10-31 DIAGNOSIS — R05 Cough: Secondary | ICD-10-CM | POA: Diagnosis present

## 2016-10-31 DIAGNOSIS — R1013 Epigastric pain: Secondary | ICD-10-CM | POA: Insufficient documentation

## 2016-10-31 DIAGNOSIS — J45909 Unspecified asthma, uncomplicated: Secondary | ICD-10-CM | POA: Insufficient documentation

## 2016-10-31 DIAGNOSIS — B349 Viral infection, unspecified: Secondary | ICD-10-CM | POA: Diagnosis not present

## 2016-10-31 DIAGNOSIS — F17210 Nicotine dependence, cigarettes, uncomplicated: Secondary | ICD-10-CM | POA: Diagnosis not present

## 2016-10-31 DIAGNOSIS — Z79899 Other long term (current) drug therapy: Secondary | ICD-10-CM | POA: Insufficient documentation

## 2016-10-31 LAB — CBC WITH DIFFERENTIAL/PLATELET
Basophils Absolute: 0 10*3/uL (ref 0.0–0.1)
Basophils Relative: 0 %
EOS ABS: 0 10*3/uL (ref 0.0–0.7)
EOS PCT: 0 %
HCT: 39.5 % (ref 39.0–52.0)
Hemoglobin: 14.3 g/dL (ref 13.0–17.0)
LYMPHS ABS: 0.9 10*3/uL (ref 0.7–4.0)
Lymphocytes Relative: 18 %
MCH: 29.7 pg (ref 26.0–34.0)
MCHC: 36.2 g/dL — AB (ref 30.0–36.0)
MCV: 82 fL (ref 78.0–100.0)
MONO ABS: 0.6 10*3/uL (ref 0.1–1.0)
Monocytes Relative: 13 %
Neutro Abs: 3.3 10*3/uL (ref 1.7–7.7)
Neutrophils Relative %: 69 %
PLATELETS: 162 10*3/uL (ref 150–400)
RBC: 4.82 MIL/uL (ref 4.22–5.81)
RDW: 12.6 % (ref 11.5–15.5)
WBC: 4.8 10*3/uL (ref 4.0–10.5)

## 2016-10-31 LAB — COMPREHENSIVE METABOLIC PANEL
ALT: 19 U/L (ref 17–63)
ANION GAP: 7 (ref 5–15)
AST: 22 U/L (ref 15–41)
Albumin: 4.2 g/dL (ref 3.5–5.0)
Alkaline Phosphatase: 50 U/L (ref 38–126)
BUN: 10 mg/dL (ref 6–20)
CHLORIDE: 102 mmol/L (ref 101–111)
CO2: 26 mmol/L (ref 22–32)
Calcium: 8.6 mg/dL — ABNORMAL LOW (ref 8.9–10.3)
Creatinine, Ser: 1.14 mg/dL (ref 0.61–1.24)
GFR calc Af Amer: >60 mL/min (ref >60)
GFR calc non Af Amer: >60 mL/min (ref >60)
Glucose, Bld: 99 mg/dL (ref 65–99)
POTASSIUM: 3.6 mmol/L (ref 3.5–5.1)
SODIUM: 135 mmol/L (ref 135–145)
Total Bilirubin: 0.4 mg/dL (ref 0.3–1.2)
Total Protein: 6.7 g/dL (ref 6.5–8.1)

## 2016-10-31 LAB — URINALYSIS, ROUTINE W REFLEX MICROSCOPIC
Bilirubin Urine: NEGATIVE
Glucose, UA: NEGATIVE mg/dL
Hgb urine dipstick: NEGATIVE
Ketones, ur: NEGATIVE mg/dL
LEUKOCYTES UA: NEGATIVE
NITRITE: NEGATIVE
PROTEIN: NEGATIVE mg/dL
Specific Gravity, Urine: 1.025 (ref 1.005–1.030)
pH: 6 (ref 5.0–8.0)

## 2016-10-31 LAB — LIPASE, BLOOD: LIPASE: 22 U/L (ref 11–51)

## 2016-10-31 MED ORDER — IBUPROFEN 800 MG PO TABS
800.0000 mg | ORAL_TABLET | Freq: Once | ORAL | Status: AC
  Administered 2016-11-01: 800 mg via ORAL
  Filled 2016-10-31: qty 1

## 2016-10-31 MED ORDER — ACETAMINOPHEN 500 MG PO TABS
1000.0000 mg | ORAL_TABLET | Freq: Once | ORAL | Status: AC
  Administered 2016-10-31: 1000 mg via ORAL
  Filled 2016-10-31: qty 2

## 2016-10-31 MED ORDER — BENZONATATE 100 MG PO CAPS: 200.0000 mg | ORAL_CAPSULE | Freq: Two times a day (BID) | ORAL | 0 refills | Status: DC | PRN

## 2016-10-31 MED ORDER — OXYMETAZOLINE HCL 0.05 % NA SOLN: 1.0000 | Freq: Two times a day (BID) | NASAL | 0 refills | Status: DC

## 2016-10-31 MED ORDER — ONDANSETRON HCL 4 MG/2ML IJ SOLN
4.0000 mg | Freq: Once | INTRAMUSCULAR | Status: AC
  Administered 2016-10-31: 4 mg via INTRAVENOUS
  Filled 2016-10-31: qty 2

## 2016-10-31 MED ORDER — SODIUM CHLORIDE 0.9 % IV BOLUS (SEPSIS)
1000.0000 mL | Freq: Once | INTRAVENOUS | Status: AC
  Administered 2016-10-31: 1000 mL via INTRAVENOUS

## 2016-10-31 MED ORDER — ONDANSETRON 4 MG PO TBDP: 4.0000 mg | ORAL_TABLET | Freq: Three times a day (TID) | ORAL | 0 refills | Status: DC | PRN

## 2016-10-31 NOTE — Discharge Instructions (Signed)
Take your medications as prescribed for symptomatically treatment. I also recommend continuing take Tylenol and ibuprofen as prescribed over-the-counter, altering between doses every 3-4 hours to help with your fever and body aches. Continue taking fluids at home to remain hydrated. I also recommend eating a bland diet for the next few days and see her symptoms have improved. Please follow up with a primary care provider from the Resource Guide provided below in 4-5 days if your symptoms have not improved. Please return to the Emergency Department if symptoms worsen or new onset of headache, neck stiffness, rash, chest pain, difficulty breathing, vomiting, unable to keep fluids down, new/worsening abdominal pain, blood in vomit or stool.

## 2016-10-31 NOTE — ED Provider Notes (Signed)
[facility]-EMERGENCY DEPT Provider Note   CSN: [id number] Arrival date & time: 10/31/16  1655  By signing my name below, I, Tommy Thompson, attest that this documentation has been prepared under the direction and in the presence of non-physician practitioner, Melburn Hake, PA. Electronically Signed: Modena Thompson, Scribe. 10/31/2016. 9:35 PM.  History   Chief Complaint Chief Complaint  Patient presents with  . flu like symptoms  . Abdominal Pain  . Chills   The history is provided by the patient. No language interpreter was used.    HPI Comments: Tommy Thompson is a 31 y.o. male with a PMHx of IBS who presents to the Emergency Department complaining of constant mild upper abdominal pain that started this morning. He states he has been having gradually worsening URI-like symptoms for the past 4 days. He was evaluated yesterday in the ED and d/c home with tylenol and ibuprofen which he reports taking at home. He has been taking ibuprofen and tylenol with some relief (last dose: 2:30pm). He describes the abdominal pain as stabbing. He has been eating and drinking less. He reports associated symptoms of dry cough (reports much improved and now only has minimal cough), sore throat (improving), chest tightness (with cough), decreased appetite, generalized myalgias, diaphoresis, chills, rhinorrhea, and nasal congestion. He admits to sick contacts, Mother and sister at home with similar symptoms over the past week. He denies any influenza vaccination this year, SOB, wheezing, vomiting, diarrhea, dysuria, rash, or other complaints.   Past Medical History:  Diagnosis Date  . Asthma   . History of IBS     There are no active problems to display for this patient.   History reviewed. No pertinent surgical history.     Home Medications    Prior to Admission medications   Medication Sig Start Date End Date Taking? Authorizing Provider  acetaminophen (TYLENOL) 500 MG tablet Take 1,000 mg by mouth  every 6 (six) hours as needed for moderate pain.   Yes Historical Provider, MD  ibuprofen (ADVIL) 200 MG tablet Take 400 mg by mouth every 6 (six) hours as needed for moderate pain.   Yes Historical Provider, MD  Multiple Vitamin (MULTIVITAMIN WITH MINERALS) TABS tablet Take 1 tablet by mouth daily.   Yes Historical Provider, MD  benzonatate (TESSALON) 100 MG capsule Take 2 capsules (200 mg total) by mouth 2 (two) times daily as needed for cough. 10/31/16   Barrett Henle, PA-C  HYDROcodone-acetaminophen (NORCO/VICODIN) 5-325 MG tablet Take 1-2 tablets by mouth every 6 (six) hours as needed. Patient not taking: Reported on 10/31/2016 04/21/16   Kristen N Ward, DO  ibuprofen (ADVIL,MOTRIN) 800 MG tablet Take 1 tablet (800 mg total) by mouth every 8 (eight) hours as needed for mild pain. Patient not taking: Reported on 10/31/2016 04/21/16   Kristen N Ward, DO  ondansetron (ZOFRAN ODT) 4 MG disintegrating tablet Take 1 tablet (4 mg total) by mouth every 8 (eight) hours as needed for nausea or vomiting. 10/31/16   Barrett Henle, PA-C  oxymetazoline (AFRIN NASAL SPRAY) 0.05 % nasal spray Place 1 spray into both nostrils 2 (two) times daily. Spray once into each nostril twice daily for up to the next 3 days. Do not use for more than 3 days to prevent rebound rhinorrhea. 10/31/16   Barrett Henle, PA-C    Family History Family History  Problem Relation Age of Onset  . Diabetes Mother     Social History Social History  Substance Use Topics  .  Smoking status: Current Every Day Smoker    Packs/day: 0.50    Years: 7.00    Types: Cigarettes  . Smokeless tobacco: Never Used  . Alcohol use Yes     Comment: socially     Allergies   Patient has no known allergies.   Review of Systems Review of Systems  Constitutional: Positive for appetite change, chills and diaphoresis.  HENT: Positive for congestion (Nasal), rhinorrhea and sore throat.   Respiratory: Positive for cough  and chest tightness. Negative for shortness of breath.   Cardiovascular: Negative for chest pain.  Gastrointestinal: Positive for abdominal pain. Negative for diarrhea and vomiting.  Genitourinary: Negative for dysuria.  Musculoskeletal: Positive for myalgias (Genralized).  Skin: Negative for rash.  All other systems reviewed and are negative.    Physical Exam Updated Vital Signs BP 126/71 (BP Location: Right Arm)   Pulse 86   Temp 98.6 F (37 C) (Oral)   Resp 18   Ht 5\' 11"  (1.803 m)   Wt 154 lb (69.9 kg)   SpO2 98%   BMI 21.48 kg/m   Physical Exam  Constitutional: He is oriented to person, place, and time. He appears well-developed and well-nourished. No distress.  HENT:  Head: Normocephalic and atraumatic.  Right Ear: Tympanic membrane normal.  Left Ear: Tympanic membrane normal.  Nose: Nose normal. Right sinus exhibits no maxillary sinus tenderness and no frontal sinus tenderness. Left sinus exhibits no maxillary sinus tenderness and no frontal sinus tenderness.  Mouth/Throat: Uvula is midline, oropharynx is clear and moist and mucous membranes are normal. No oropharyngeal exudate, posterior oropharyngeal edema, posterior oropharyngeal erythema or tonsillar abscesses. No tonsillar exudate.  Eyes: Conjunctivae and EOM are normal. Right eye exhibits no discharge. Left eye exhibits no discharge. No scleral icterus.  Neck: Normal range of motion. Neck supple.  Cardiovascular: Normal rate, regular rhythm, normal heart sounds and intact distal pulses.   Pulmonary/Chest: Effort normal and breath sounds normal. No respiratory distress. He has no wheezes. He has no rales. He exhibits no tenderness.  Abdominal: Soft. Bowel sounds are normal. He exhibits no distension and no mass. There is tenderness. There is no rebound and no guarding. No hernia.  Mild TTP over epigastric region.   Musculoskeletal: Normal range of motion. He exhibits no edema.  Lymphadenopathy:    He has no cervical  adenopathy.  Neurological: He is alert and oriented to person, place, and time.  Skin: Skin is warm and dry. He is not diaphoretic.  Nursing note and vitals reviewed.    ED Treatments / Results  DIAGNOSTIC STUDIES: Oxygen Saturation is 98% on RA, normal by my interpretation.    COORDINATION OF CARE: 9:39 PM- Pt advised of plan for treatment and pt agrees.  Labs (all labs ordered are listed, but only abnormal results are displayed) Labs Reviewed  CBC WITH DIFFERENTIAL/PLATELET - Abnormal; Notable for the following:       Result Value   MCHC 36.2 (*)    All other components within normal limits  COMPREHENSIVE METABOLIC PANEL - Abnormal; Notable for the following:    Calcium 8.6 (*)    All other components within normal limits  LIPASE, BLOOD  URINALYSIS, ROUTINE W REFLEX MICROSCOPIC    EKG  EKG Interpretation None       Radiology No results found.  Procedures Procedures (including critical care time)  Medications Ordered in ED Medications  ibuprofen (ADVIL,MOTRIN) tablet 800 mg (not administered)  sodium chloride 0.9 % bolus 1,000 mL (1,000 mLs  Intravenous New Bag/Given 10/31/16 2223)  ondansetron [facility]) injection 4 mg (4 mg Intravenous Given 10/31/16 2223)  acetaminophen (TYLENOL) tablet 1,000 mg (1,000 mg Oral Given 10/31/16 2224)     Initial Impression / Assessment and Plan / ED Course  I have reviewed the triage vital signs and the nursing notes.  Pertinent labs & imaging results that were available during my care of the patient were reviewed by me and considered in my medical decision making (see chart for details).     Patient with symptoms consistent with influenza.  Vitals are stable, low-grade fever.  No signs of dehydration, tolerating PO's.  Lungs are clear. Due to patient's presentation and physical exam a chest x-ray was not ordered bc likely diagnosis of flu.  Discussed the cost versus benefit of Tamiflu treatment with the patient.  The patient  understands that symptoms are greater than the recommended 24-48 hour window of treatment.  Patient will be discharged with instructions to orally hydrate, rest, and use over-the-counter medications such as anti-inflammatories ibuprofen and Aleve for muscle aches and Tylenol for fever.  Patient will also be given a cough suppressant.    Final Clinical Impressions(s) / ED Diagnoses   Final diagnoses:  Viral illness    New Prescriptions New Prescriptions   BENZONATATE (TESSALON) 100 MG CAPSULE    Take 2 capsules (200 mg total) by mouth 2 (two) times daily as needed for cough.   ONDANSETRON (ZOFRAN ODT) 4 MG DISINTEGRATING TABLET    Take 1 tablet (4 mg total) by mouth every 8 (eight) hours as needed for nausea or vomiting.   OXYMETAZOLINE (AFRIN NASAL SPRAY) 0.05 % NASAL SPRAY    Place 1 spray into both nostrils 2 (two) times daily. Spray once into each nostril twice daily for up to the next 3 days. Do not use for more than 3 days to prevent rebound rhinorrhea.   I personally performed the services described in this documentation, which was scribed in my presence. The recorded information has been reviewed and is accurate.     Satira Sark Keyser, [place] 10/31/16 1610    Jacalyn Lefevre, MD 11/01/16 [phone]

## 2016-10-31 NOTE — ED Triage Notes (Signed)
Patient c/o flu-like symptoms-chills, non-productive cough, body aches, nausea, and upper abdominal pain  X 4 days. Patient denies diarrhea and fever. [facility].

## 2017-02-24 ENCOUNTER — Emergency Department (HOSPITAL_COMMUNITY): Payer: Managed Care, Other (non HMO)

## 2017-02-24 ENCOUNTER — Emergency Department (HOSPITAL_COMMUNITY)
Admission: EM | Admit: 2017-02-24 | Discharge: 2017-02-24 | Disposition: A | Discharge status: Home / Self Care | Payer: Managed Care, Other (non HMO) | Attending: Emergency Medicine | Admitting: Emergency Medicine

## 2017-02-24 DIAGNOSIS — F17210 Nicotine dependence, cigarettes, uncomplicated: Secondary | ICD-10-CM | POA: Insufficient documentation

## 2017-02-24 DIAGNOSIS — Y999 Unspecified external cause status: Secondary | ICD-10-CM | POA: Diagnosis not present

## 2017-02-24 DIAGNOSIS — Y9301 Activity, walking, marching and hiking: Secondary | ICD-10-CM | POA: Diagnosis not present

## 2017-02-24 DIAGNOSIS — X501XXA Overexertion from prolonged static or awkward postures, initial encounter: Secondary | ICD-10-CM | POA: Diagnosis not present

## 2017-02-24 DIAGNOSIS — J45909 Unspecified asthma, uncomplicated: Secondary | ICD-10-CM | POA: Diagnosis not present

## 2017-02-24 DIAGNOSIS — S99921A Unspecified injury of right foot, initial encounter: Secondary | ICD-10-CM | POA: Diagnosis present

## 2017-02-24 DIAGNOSIS — S93601A Unspecified sprain of right foot, initial encounter: Secondary | ICD-10-CM | POA: Diagnosis not present

## 2017-02-24 DIAGNOSIS — Y929 Unspecified place or not applicable: Secondary | ICD-10-CM | POA: Diagnosis not present

## 2017-02-24 MED ORDER — IBUPROFEN 800 MG PO TABS: 800.0000 mg | ORAL_TABLET | Freq: Three times a day (TID) | ORAL | 0 refills | Status: DC | PRN

## 2017-02-24 NOTE — ED Triage Notes (Signed)
Pt reports injurying is right ankle while walking down stairs. Pt presents w/ swelling and 7/10 pain to right ankle.

## 2017-02-24 NOTE — Discharge Instructions (Signed)
Take ibuprofen every 8 hours for pain control. Keep your foot elevated as much as possible. Ice area of injury 3-4 times per day to limit swelling. We also advised the use of crutches to prevent from putting weight on your right foot and to allow for healing. Follow-up with an orthopedist if symptoms persist.

## 2017-02-24 NOTE — ED Provider Notes (Signed)
[facility]-EMERGENCY DEPT Provider Note   CSN: [id number] Arrival date & time: 02/24/17  0321    History   Chief Complaint Chief Complaint  Patient presents with  . Ankle Injury    Right    HPI Tommy Thompson is a 31 y.o. male.  31 year old male presents to the emergency department for evaluation of right foot and ankle pain. Patient states that he was walking down stairs when he lost his footing causing his ankle to twist. Patient continued ambulating on his foot and ankle, but noticed worsening pain after a few hours. He also reports progressive swelling. He applied ice prior to arrival with little improvement. He also reports taking a medication at his work which provided moderate pain relief. He is unsure of the name of this medicine. No history of prior foot or ankle fracture.   The history is provided by the patient. No language interpreter was used.  Ankle Injury  This is a new problem. The current episode started 6 to 12 hours ago. The problem occurs constantly. The problem has been gradually worsening. The symptoms are aggravated by walking. The symptoms are relieved by medications (Took "a medicine at work" - unsure of name). Treatments tried: elevation. The treatment provided mild relief.    Past Medical History:  Diagnosis Date  . Asthma   . History of IBS     There are no active problems to display for this patient.   No past surgical history on file.     Home Medications    Prior to Admission medications   Medication Sig Start Date End Date Taking? Authorizing Provider  acetaminophen (TYLENOL) 500 MG tablet Take 1,000 mg by mouth every 6 (six) hours as needed for moderate pain.    [provider]  benzonatate (TESSALON) 100 MG capsule Take 2 capsules (200 mg total) by mouth 2 (two) times daily as needed for cough. 10/31/16   Barrett HenleNadeau, Nicole Elizabeth, PA-C  HYDROcodone-acetaminophen (NORCO/VICODIN) 5-325 MG tablet Take 1-2 tablets by mouth every 6 (six)  hours as needed. Patient not taking: Reported on 10/31/2016 04/21/16   Ward, Layla MawKristen N, DO  ibuprofen (ADVIL,MOTRIN) 800 MG tablet Take 1 tablet (800 mg total) by mouth every 8 (eight) hours as needed for mild pain. 02/24/17   Antony MaduraHumes, Deforest Maiden, PA-C  Multiple Vitamin (MULTIVITAMIN WITH MINERALS) TABS tablet Take 1 tablet by mouth daily.    [provider]  ondansetron (ZOFRAN ODT) 4 MG disintegrating tablet Take 1 tablet (4 mg total) by mouth every 8 (eight) hours as needed for nausea or vomiting. 10/31/16   Barrett HenleNadeau, Nicole Elizabeth, PA-C  oxymetazoline (AFRIN NASAL SPRAY) 0.05 % nasal spray Place 1 spray into both nostrils 2 (two) times daily. Spray once into each nostril twice daily for up to the next 3 days. Do not use for more than 3 days to prevent rebound rhinorrhea. 10/31/16   Barrett HenleNadeau, Nicole Elizabeth, PA-C    Family History Family History  Problem Relation Age of Onset  . Diabetes Mother     Social History Social History  Substance Use Topics  . Smoking status: Current Every Day Smoker    Packs/day: 0.50    Years: 7.00    Types: Cigarettes  . Smokeless tobacco: Never Used  . Alcohol use Yes     Comment: socially     Allergies   Patient has no known allergies.   Review of Systems Review of Systems Ten systems reviewed and are negative for acute change, except as noted  in the HPI.    Physical Exam Updated Vital Signs BP 121/84 (BP Location: Left Arm)   Pulse 72   Temp 98.3 F (36.8 C) (Oral)   Resp 16   SpO2 99%   Physical Exam  Constitutional: He is oriented to person, place, and time. He appears well-developed and well-nourished. No distress.  HENT:  Head: Normocephalic and atraumatic.  Eyes: Conjunctivae and EOM are normal. No scleral icterus.  Neck: Normal range of motion.  Cardiovascular: Normal rate, regular rhythm and intact distal pulses.   Pulses:      Dorsalis pedis pulses are 2+ on the right side.       Posterior tibial pulses are 2+ on the right  side.  Pulmonary/Chest: Effort normal. No respiratory distress.  Musculoskeletal: Normal range of motion.       Right ankle: Normal. No lateral malleolus and no medial malleolus tenderness found.       Right foot: There is tenderness and swelling (mild to right lateral dorsum of foot). There is normal range of motion, normal capillary refill, no crepitus and no deformity.       Feet:  Neurological: He is alert and oriented to person, place, and time. He exhibits normal muscle tone. Coordination normal.  Skin: Skin is warm and dry. No rash noted. He is not diaphoretic. No erythema. No pallor.  Psychiatric: He has a normal mood and affect. His behavior is normal.  Nursing note and vitals reviewed.    ED Treatments / Results  Labs (all labs ordered are listed, but only abnormal results are displayed) Labs Reviewed - No data to display  EKG  EKG Interpretation None       Radiology Dg Ankle Complete Right  Result Date: 02/24/2017 CLINICAL DATA:  Tripped while walking down steps with twisting injury to right ankle. Progressive right ankle pain. Swelling over lateral malleolus. EXAM: RIGHT ANKLE - COMPLETE 3+ VIEW COMPARISON:  None. FINDINGS: There is no evidence of fracture, dislocation, or joint effusion. There is no evidence of arthropathy or other focal bone abnormality. Soft tissues are unremarkable. IMPRESSION: No fracture or subluxation of the right ankle. Electronically Signed   By: Rubye Oaks M.D.   On: 02/24/2017 04:12    Procedures Procedures (including critical care time)  Medications Ordered in ED Medications - No data to display   Initial Impression / Assessment and Plan / ED Course  I have reviewed the triage vital signs and the nursing notes.  Pertinent labs & imaging results that were available during my care of the patient were reviewed by me and considered in my medical decision making (see chart for details).     31 year old male presents to the  emergency department for an ankle pain after losing his balance and rolling his ankle injury occurred at approximately 2100. Patient neurovascularly intact on exam. There is mild swelling. No crepitus or deformity. Ankle x-ray is reassuring. Plan for supportive management with Ace wrap, crutches for WBAT, and NSAIDs. Orthopedic referral provided should symptoms persist. Patient discharged in stable condition with no unaddressed concerns.   Final Clinical Impressions(s) / ED Diagnoses   Final diagnoses:  Foot sprain, right, initial encounter    New Prescriptions Current Discharge Medication List       Antony Madura, Cordelia Poche 02/24/17 0459    Devoria Albe, MD 02/24/17 [phone]

## 2017-02-24 NOTE — ED Notes (Signed)
Patient is alert and oriented x3.  He was given DC instructions and follow up visit instructions.  Patient gave verbal understanding.  He was DC ambulatory under his own power to home.  V/S stable.  He was not showing any signs of distress on DC 

## 2018-09-10 ENCOUNTER — Encounter: Payer: Self-pay | Admitting: Emergency Medicine

## 2018-09-10 ENCOUNTER — Emergency Department
Admission: EM | Admit: 2018-09-10 | Discharge: 2018-09-10 | Disposition: A | Discharge status: Home / Self Care | Payer: Managed Care, Other (non HMO) | Attending: Emergency Medicine | Admitting: Emergency Medicine

## 2018-09-10 ENCOUNTER — Other Ambulatory Visit: Payer: Self-pay

## 2018-09-10 DIAGNOSIS — W540XXA Bitten by dog, initial encounter: Secondary | ICD-10-CM | POA: Insufficient documentation

## 2018-09-10 DIAGNOSIS — Z23 Encounter for immunization: Secondary | ICD-10-CM | POA: Insufficient documentation

## 2018-09-10 DIAGNOSIS — Y92019 Unspecified place in single-family (private) house as the place of occurrence of the external cause: Secondary | ICD-10-CM | POA: Insufficient documentation

## 2018-09-10 DIAGNOSIS — F17210 Nicotine dependence, cigarettes, uncomplicated: Secondary | ICD-10-CM | POA: Insufficient documentation

## 2018-09-10 DIAGNOSIS — S71151A Open bite, right thigh, initial encounter: Secondary | ICD-10-CM | POA: Insufficient documentation

## 2018-09-10 DIAGNOSIS — Y998 Other external cause status: Secondary | ICD-10-CM | POA: Insufficient documentation

## 2018-09-10 DIAGNOSIS — Y9389 Activity, other specified: Secondary | ICD-10-CM | POA: Insufficient documentation

## 2018-09-10 DIAGNOSIS — J45909 Unspecified asthma, uncomplicated: Secondary | ICD-10-CM | POA: Insufficient documentation

## 2018-09-10 MED ORDER — TETANUS-DIPHTH-ACELL PERTUSSIS 5-2.5-18.5 LF-MCG/0.5 IM SUSP
0.5000 mL | Freq: Once | INTRAMUSCULAR | Status: AC
  Administered 2018-09-10: 0.5 mL via INTRAMUSCULAR
  Filled 2018-09-10: qty 0.5

## 2018-09-10 MED ORDER — AMOXICILLIN-POT CLAVULANATE 875-125 MG PO TABS: 1.0000 | ORAL_TABLET | Freq: Two times a day (BID) | ORAL | 0 refills | Status: AC

## 2018-09-10 MED ORDER — AMOXICILLIN-POT CLAVULANATE 875-125 MG PO TABS
1.0000 | ORAL_TABLET | Freq: Once | ORAL | Status: AC
  Administered 2018-09-10: 1 via ORAL
  Filled 2018-09-10: qty 1

## 2018-09-10 NOTE — Discharge Instructions (Signed)
You have been treated for a dog bite. Take the antibiotic as directed. Keep the wound clean, dry, and covered. Follow-up with [facility] or the provider approved by your company. [place] [facility] for rabies status of the dog after 10 days.

## 2018-09-10 NOTE — ED Provider Notes (Signed)
[facility] Emergency Department Provider Note ____________________________________________  Time seen: 1450  I have reviewed the triage vital signs and the nursing notes.  HISTORY  Chief Complaint  Animal Bite  HPI Tommy Thompson is a 32 y.o. male who presents to the ED for evaluation and management of a dog bite.  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  He describes the home owner open the door to let the dog out when it bit him. Patient describes he was bitten by dog 20 minutes prior to arrival.  He sustained a bite to the right thigh.  He notes that the dog is not known to him, and vaccines of the dogs were expired.  He is unclear of his tetanus status at this time.  The dog reportedly has not had a date or expired rabies vaccine status.  The dog otherwise was well kempt, inside dog, who was not obviously in distress.   Past Medical History:  Diagnosis Date  . Asthma   . History of IBS     There are no active problems to display for this patient.   History reviewed. No pertinent surgical history.  Prior to Admission medications   Medication Sig Start Date End Date Taking? Authorizing Provider  acetaminophen (TYLENOL) 500 MG tablet Take 1,000 mg by mouth every 6 (six) hours as needed for moderate pain.    [provider]  benzonatate (TESSALON) 100 MG capsule Take 2 capsules (200 mg total) by mouth 2 (two) times daily as needed for cough. 10/31/16   Barrett Henle, PA-C  HYDROcodone-acetaminophen (NORCO/VICODIN) 5-325 MG tablet Take 1-2 tablets by mouth every 6 (six) hours as needed. Patient not taking: Reported on 10/31/2016 04/21/16   Ward, Layla Maw, DO  ibuprofen (ADVIL,MOTRIN) 800 MG tablet Take 1 tablet (800 mg total) by mouth every 8 (eight) hours as needed for mild pain. 02/24/17   Antony Madura, PA-C  Multiple Vitamin (MULTIVITAMIN WITH MINERALS) TABS tablet Take 1 tablet by mouth daily.     [provider]  ondansetron (ZOFRAN ODT) 4 MG disintegrating tablet Take 1 tablet (4 mg total) by mouth every 8 (eight) hours as needed for nausea or vomiting. 10/31/16   Barrett Henle, PA-C  oxymetazoline (AFRIN NASAL SPRAY) 0.05 % nasal spray Place 1 spray into both nostrils 2 (two) times daily. Spray once into each nostril twice daily for up to the next 3 days. Do not use for more than 3 days to prevent rebound rhinorrhea. 10/31/16   Barrett Henle, PA-C    Allergies Patient has no known allergies.  Family History  Problem Relation Age of Onset  . Diabetes Mother     Social History Social History   Tobacco Use  . Smoking status: Current Every Day Smoker    Packs/day: 0.50    Years: 7.00    Pack years: 3.50    Types: Cigarettes  . Smokeless tobacco: Never Used  Substance Use Topics  . Alcohol use: Yes    Comment: socially  . Drug use: No    Review of Systems  Constitutional: Negative for fever. Cardiovascular: Negative for chest pain. Respiratory: Negative for shortness of breath. Musculoskeletal: Negative for back pain. Skin: Negative for rash. Right thigh dog bite Neurological: Negative for headaches, focal weakness or numbness. ____________________________________________  PHYSICAL EXAM:  VITAL SIGNS: ED Triage Vitals  Enc Vitals Group     BP 09/10/18 1418 136/76     Pulse  Rate 09/10/18 1418 65     Resp 09/10/18 1418 18     Temp 09/10/18 1418 97.9 F (36.6 C)     Temp Source 09/10/18 1418 Oral     SpO2 09/10/18 1418 100 %     Weight 09/10/18 1419 160 lb (72.6 kg)     Height 09/10/18 1419 5\' 10"  (1.778 m)     Head Circumference --      Peak Flow --      Pain Score 09/10/18 1418 8     Pain Loc --      Pain Edu? --      Excl. in GC? --     Constitutional: Alert and oriented. Well appearing and in no distress. Head: Normocephalic and atraumatic. Eyes: Conjunctivae are normal. Normal extraocular movements Cardiovascular:  Normal rate, regular rhythm. Normal distal pulses. Respiratory: Normal respiratory effort. No wheezes/rales/rhonchi. Musculoskeletal: Nontender with normal range of motion in all extremities.  Right anterior thigh with a superficial bite to the middle thigh.  No active bleeding is appreciated.  Normal range of motion of the lower leg distally. Neurologic:  Normal gait without ataxia. Normal speech and language. No gross focal neurologic deficits are appreciated. Skin:  Skin is warm, dry and intact. No rash noted. ____________________________________________  PROCEDURES  Procedures Tdap 0.5 ml IM Augmentin 875 mg PO ____________________________________________  INITIAL IMPRESSION / ASSESSMENT AND PLAN / ED COURSE  Patient with ED evaluation and management of a dog bite sustained to the right thigh.  Patient presents for management of a work-related injury at this time.  His tetanus is updated, and started on prophylactic Augmentin.  The dog [place] animal control in [place] at this time.  Patient will follow up with animal control for quarantine status at the end of the observation period. The patient has not need for prophylactic rabies immunization at this time. Wound care instructions and return precautions have been reviewed with the patient. ____________________________________________  FINAL CLINICAL IMPRESSION(S) / ED DIAGNOSES  Final diagnoses:  Dog bite, initial encounter      Lissa HoardMenshew, Candelaria Pies V Bacon, PA-C 09/10/18 1513    Sharyn CreamerQuale, Mark, MD 09/10/18 2305

## 2018-09-10 NOTE — ED Notes (Signed)
See triage note  States he was bitten this afternoon by a dog which is not known to him.  States the dog has been caught and taken to quarantine..  The dog is not up to date on shot

## 2018-09-10 NOTE — ED Triage Notes (Signed)
States was bitten by dog unknown to him 20 min ago on R thigh. States police were on scene and stated dogs vaccines were expired.

## 2019-08-07 ENCOUNTER — Encounter (HOSPITAL_BASED_OUTPATIENT_CLINIC_OR_DEPARTMENT_OTHER): Payer: Self-pay

## 2019-08-07 ENCOUNTER — Emergency Department (HOSPITAL_BASED_OUTPATIENT_CLINIC_OR_DEPARTMENT_OTHER): Payer: Self-pay

## 2019-08-07 ENCOUNTER — Emergency Department (HOSPITAL_BASED_OUTPATIENT_CLINIC_OR_DEPARTMENT_OTHER): Payer: Self-pay | Attending: Emergency Medicine

## 2019-08-07 ENCOUNTER — Other Ambulatory Visit: Payer: Self-pay

## 2019-08-07 ENCOUNTER — Emergency Department (HOSPITAL_BASED_OUTPATIENT_CLINIC_OR_DEPARTMENT_OTHER)
Admission: EM | Admit: 2019-08-07 | Discharge: 2019-08-07 | Disposition: A | Discharge status: Home / Self Care | Payer: Worker's Compensation | Attending: Emergency Medicine | Admitting: Emergency Medicine

## 2019-08-07 DIAGNOSIS — S92001A Unspecified fracture of right calcaneus, initial encounter for closed fracture: Secondary | ICD-10-CM | POA: Diagnosis not present

## 2019-08-07 DIAGNOSIS — J45909 Unspecified asthma, uncomplicated: Secondary | ICD-10-CM | POA: Diagnosis not present

## 2019-08-07 DIAGNOSIS — S92211A Displaced fracture of cuboid bone of right foot, initial encounter for closed fracture: Secondary | ICD-10-CM

## 2019-08-07 DIAGNOSIS — Y990 Civilian activity done for income or pay: Secondary | ICD-10-CM | POA: Insufficient documentation

## 2019-08-07 DIAGNOSIS — W1842XA Slipping, tripping and stumbling without falling due to stepping into hole or opening, initial encounter: Secondary | ICD-10-CM | POA: Insufficient documentation

## 2019-08-07 DIAGNOSIS — Y9289 Other specified places as the place of occurrence of the external cause: Secondary | ICD-10-CM | POA: Insufficient documentation

## 2019-08-07 DIAGNOSIS — F17210 Nicotine dependence, cigarettes, uncomplicated: Secondary | ICD-10-CM | POA: Insufficient documentation

## 2019-08-07 DIAGNOSIS — Y9389 Activity, other specified: Secondary | ICD-10-CM | POA: Diagnosis not present

## 2019-08-07 DIAGNOSIS — S99921A Unspecified injury of right foot, initial encounter: Secondary | ICD-10-CM | POA: Diagnosis present

## 2019-08-07 DIAGNOSIS — W19XXXA Unspecified fall, initial encounter: Secondary | ICD-10-CM

## 2019-08-07 MED ORDER — HYDROCODONE-ACETAMINOPHEN 5-325 MG PO TABS: 2.0000 | ORAL_TABLET | Freq: Two times a day (BID) | ORAL | 0 refills | Status: DC | PRN

## 2019-08-07 MED ORDER — IBUPROFEN 400 MG PO TABS
600.0000 mg | ORAL_TABLET | Freq: Once | ORAL | Status: AC
  Administered 2019-08-07: 21:00:00 600 mg via ORAL
  Filled 2019-08-07: qty 1

## 2019-08-07 NOTE — ED Notes (Signed)
ED Provider at bedside. 

## 2019-08-07 NOTE — Discharge Instructions (Addendum)
You may alternate taking Tylenol and Ibuprofen as needed for pain control. You may take 400-600 mg of ibuprofen every 6 hours and 500 mg of Tylenol every 6 hours. If you have breakthrough pain you may take one Hydrocodone every 12 hours.  Do not exceed 4000 mg of Tylenol daily as this can lead to liver damage. Also, make sure to take Ibuprofen with meals as it can cause an upset stomach. Do not take other NSAIDs while taking Ibuprofen such as (Aleve, Naprosyn, Aspirin, Celebrex, etc) and do not take more than the prescribed dose as this can lead to ulcers and bleeding in your GI tract. You may use warm and cold compresses to help with your symptoms.   Prescription given for Norco. Take medication as directed and do not operate machinery, drive a car, or work while taking this medication as it can make you drowsy.   Do not put any weight on the foot until you follow up with orthopedics.  Please follow up with the orthopedic doctor within the next 7-10 days for re-evaluation and further treatment of your symptoms.   Please return to the ER sooner if you have any new or worsening symptoms.

## 2019-08-07 NOTE — ED Notes (Signed)
Patient transported to X-ray 

## 2019-08-07 NOTE — ED Notes (Signed)
Pt states no drug screen for paper work need for work.

## 2019-08-07 NOTE — ED Provider Notes (Signed)
[place] [facility] EMERGENCY DEPARTMENT Provider Note   CSN: [id number] Arrival date & time: 08/07/19  1938     History   Chief Complaint Chief Complaint  Patient presents with  . Ankle Injury    HPI Tommy Thompson is a 33 y.o. male.     HPI  Patient is a 33 year old male with history of asthma, IBS, who presents the emergency department today for evaluation of right ankle/foot pain.  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  He had sudden onset of severe pain to the area.  He does have some swelling to the foot.  Denies any numbness.  It hurts when he moves his foot.  Has had no interventions for pain prior to arrival.  Denies other injuries from the fall.  Past Medical History:  Diagnosis Date  . Asthma   . History of IBS     There are no active problems to display for this patient.   History reviewed. No pertinent surgical history.      Home Medications    Prior to Admission medications   Medication Sig Start Date End Date Taking? Authorizing Provider  HYDROcodone-acetaminophen (NORCO/VICODIN) 5-325 MG tablet Take 2 tablets by mouth every 12 (twelve) hours as needed. 08/07/19   Lorel Lembo S, PA-C    Family History Family History  Problem Relation Age of Onset  . Diabetes Mother     Social History Social History   Tobacco Use  . Smoking status: Current Every Day Smoker    Packs/day: 0.50    Years: 7.00    Pack years: 3.50    Types: Cigarettes  . Smokeless tobacco: Never Used  Substance Use Topics  . Alcohol use: Yes    Comment: occ  . Drug use: No     Allergies   Patient has no known allergies.   Review of Systems Review of Systems  Constitutional: Negative for fever.  Musculoskeletal:       Right foot/ankle pain  Neurological: Negative for weakness and headaches.     Physical Exam Updated Vital Signs BP 115/65 (BP Location: Right  Arm)   Pulse 74   Temp 99.2 F (37.3 C) (Oral)   Resp 18   Ht 5\' 11"  (1.803 m)   Wt 79.4 kg   SpO2 100%   BMI 24.41 kg/m   Physical Exam Constitutional:      General: He is not in acute distress.    Appearance: He is well-developed.  Eyes:     Conjunctiva/sclera: Conjunctivae normal.  Cardiovascular:     Rate and Rhythm: Normal rate and regular rhythm.  Pulmonary:     Effort: Pulmonary effort is normal.     Breath sounds: Normal breath sounds.  Musculoskeletal:     Comments: Mild TTP to the lateral malleolus and to the right lateral foot with associated swelling. Decreased ROM 2/2 pain. Normal sensation and and normal distal pulses.  Skin:    General: Skin is warm and dry.  Neurological:     Mental Status: He is alert and oriented to person, place, and time.      ED Treatments / Results  Labs (all labs ordered are listed, but only abnormal results are displayed) Labs Reviewed - No data to display  EKG None  Radiology Dg Ankle Complete Right  Result Date: 08/07/2019 CLINICAL DATA:  Ankle pain EXAM: RIGHT ANKLE - COMPLETE 3+ VIEW COMPARISON:  Feb 24, 2017 FINDINGS: There is a small osseous fragment on the frontal view involving the lateral aspect of the calcaneus. There is mild surrounding soft tissue swelling. IMPRESSION: Avulsion fracture involving the lateral aspect of the calcaneus likely related to the insertion of the extensor digitorum brevis. Electronically Signed   By: Katherine Mantle M.D.   On: 08/07/2019 20:26   Dg Foot Complete Right  Result Date: 08/07/2019 CLINICAL DATA:  33 year old male with fall and trauma to the right foot. EXAM: RIGHT FOOT COMPLETE - 3+ VIEW COMPARISON:  Right ankle radiograph dated 08/07/2019 FINDINGS: Small bone fragment along the lateral aspect of the hindfoot most consistent with an avulsion fracture from the lateral anterior corner of the calcaneus at the calcaneocuboid articulation. A small avulsion injury of the corner of  the cuboid may also be present. There is no dislocation. The bones are well mineralized. Mild hallux valgus.Mild degenerative changes of the talonavicular joint. Soft tissue swelling of the lateral hindfoot. IMPRESSION: Avulsion injury of the lateral corner of the anterior calcaneus and possible smaller avulsion injury from the lateral cortex of the cuboid at the calcaneocuboid articulation. Electronically Signed   By: Elgie Collard M.D.   On: 08/07/2019 20:29    Procedures Procedures (including critical care time)  Medications Ordered in ED Medications  ibuprofen (ADVIL) tablet 600 mg (600 mg Oral Given 08/07/19 2058)     Initial Impression / Assessment and Plan / ED Course  I have reviewed the triage vital signs and the nursing notes.  Pertinent labs & imaging results that were available during my care of the patient were reviewed by me and considered in my medical decision making (see chart for details).    Final Clinical Impressions(s) / ED Diagnoses   Final diagnoses:  Closed displaced fracture of right calcaneus, unspecified portion of calcaneus, initial encounter  Closed displaced fracture of cuboid of right foot, initial encounter  Fall, initial encounter   Patient with mechanical fall and injury to the right ankle/foot prior to arrival.  No other injuries.  Does have some tenderness to the ankle and foot on exam.  Neurovascularly intact.  X-ray right foot:  Avulsion injury of the lateral corner of the anterior calcaneus and possible smaller avulsion injury from the lateral cortex of the cuboid at the calcaneocuboid articulation.  X-ray right ankle:  Avulsion fracture involving the lateral aspect of the calcaneus likely related to the insertion of the extensor digitorum brevis.  Discussed results with patient.  He will be placed in posterior ankle splint and given crutches.  He will be given orthopedic follow-up.  He will be advised to call orthopedics to schedule an  appointment within the next week for reevaluation.  Advised to be nonweightbearing.  Advised on specific return precautions.  He voiced understanding of the plan and reasons to return.  All questions answered.  Patient stable for discharge.  ED Discharge Orders         Ordered    HYDROcodone-acetaminophen (NORCO/VICODIN) 5-325 MG tablet  Every 12 hours PRN     08/07/19 2111           Rayne Du 08/07/19 2114    Alvira Monday, MD 08/08/19 1258

## 2019-08-07 NOTE — ED Triage Notes (Signed)
Pt c/o pain to right ankle after stepping in a hole at work ~430pm-NAD-to triage in w/c

## 2019-08-14 ENCOUNTER — Encounter (HOSPITAL_BASED_OUTPATIENT_CLINIC_OR_DEPARTMENT_OTHER): Payer: Self-pay | Admitting: *Deleted

## 2019-08-14 ENCOUNTER — Emergency Department (HOSPITAL_BASED_OUTPATIENT_CLINIC_OR_DEPARTMENT_OTHER)
Admission: EM | Admit: 2019-08-14 | Discharge: 2019-08-14 | Disposition: A | Discharge status: Home / Self Care | Payer: Worker's Compensation | Attending: Emergency Medicine | Admitting: Emergency Medicine

## 2019-08-14 ENCOUNTER — Other Ambulatory Visit: Payer: Self-pay

## 2019-08-14 ENCOUNTER — Emergency Department (HOSPITAL_BASED_OUTPATIENT_CLINIC_OR_DEPARTMENT_OTHER): Payer: Worker's Compensation

## 2019-08-14 DIAGNOSIS — J45909 Unspecified asthma, uncomplicated: Secondary | ICD-10-CM | POA: Insufficient documentation

## 2019-08-14 DIAGNOSIS — M25571 Pain in right ankle and joints of right foot: Secondary | ICD-10-CM | POA: Diagnosis present

## 2019-08-14 DIAGNOSIS — Z760 Encounter for issue of repeat prescription: Secondary | ICD-10-CM

## 2019-08-14 DIAGNOSIS — F17210 Nicotine dependence, cigarettes, uncomplicated: Secondary | ICD-10-CM | POA: Diagnosis not present

## 2019-08-14 MED ORDER — IBUPROFEN 600 MG PO TABS: 600.0000 mg | ORAL_TABLET | Freq: Four times a day (QID) | ORAL | 0 refills | Status: DC | PRN

## 2019-08-14 MED ORDER — HYDROCODONE-ACETAMINOPHEN 5-325 MG PO TABS: 1.0000 | ORAL_TABLET | Freq: Four times a day (QID) | ORAL | 0 refills | Status: AC | PRN

## 2019-08-14 MED FILL — IBUPROFEN 600 MG TABLET: 600 | 8 days supply | Qty: 30 | Fill #0

## 2019-08-14 MED FILL — HYDROCODON-APAP 5-325: 5-325 | 2 days supply | Qty: 6 | Fill #0

## 2019-08-14 NOTE — Discharge Instructions (Addendum)
Take ibuprofen 600 mg every 6 hours as needed for your pain.  For severe breakthrough pain, you can take 1 to 2 tablets of Vicodin as prescribed.  Keep your leg elevated whenever you are not walking on it.  Please follow-up with Dr. Alvan Dame as advised at your prior visit for further evaluation and treatment of your fracture.  If Worker's Comp. would prefer you to follow-up with Dr. Raeford Razor, this could be an alternative as you are injury is most likely not surgical.

## 2019-08-14 NOTE — ED Triage Notes (Signed)
States he ran out of pain medications. He has a fx right ankle.

## 2019-08-14 NOTE — ED Provider Notes (Signed)
[facility] EMERGENCY DEPARTMENT Provider Note   CSN: [id number] Arrival date & time: 08/14/19  1348     History   Chief Complaint Chief Complaint  Patient presents with  . Medication Refill    HPI Tommy Thompson is a 33 y.o. male with history of asthma who presents with right ankle pain after falling on his splint while trying to walk with crutches yesterday.  He reports that he slipped and caught himself on his foot.  His ankle was not hurting the place it has been hurting now.  He was diagnosed with calcaneal avulsion as well as cuboid avulsion 7 days ago.  Patient reports he has 1 Vicodin left.  He denies any numbness or tingling.     HPI  Past Medical History:  Diagnosis Date  . Asthma   . History of IBS     There are no active problems to display for this patient.   History reviewed. No pertinent surgical history.      Home Medications    Prior to Admission medications   Medication Sig Start Date End Date Taking? Authorizing Provider  HYDROcodone-acetaminophen (NORCO/VICODIN) 5-325 MG tablet Take 1 tablet by mouth every 6 (six) hours as needed for moderate pain. 08/14/19   Long, Wonda Olds, MD  ibuprofen (ADVIL) 600 MG tablet Take 1 tablet (600 mg total) by mouth every 6 (six) hours as needed. 08/14/19   Long, Wonda Olds, MD    Family History Family History  Problem Relation Age of Onset  . Diabetes Mother     Social History Social History   Tobacco Use  . Smoking status: Current Every Day Smoker    Packs/day: 0.50    Years: 7.00    Pack years: 3.50    Types: Cigarettes  . Smokeless tobacco: Never Used  Substance Use Topics  . Alcohol use: Yes    Comment: occ  . Drug use: No     Allergies   Patient has no known allergies.   Review of Systems Review of Systems  Constitutional: Negative for fever.  Musculoskeletal: Positive for arthralgias.  Neurological: Negative for numbness.     Physical Exam Updated Vital Signs BP 108/73    Pulse 73   Temp 98.4 F (36.9 C) (Oral)   Resp 16   Ht 5\' 9"  (1.753 m)   Wt 77.1 kg   SpO2 100%   BMI 25.10 kg/m   Physical Exam Vitals signs and nursing note reviewed.  Constitutional:      General: He is not in acute distress.    Appearance: He is well-developed. He is not diaphoretic.  HENT:     Head: Normocephalic and atraumatic.     Mouth/Throat:     Pharynx: No oropharyngeal exudate.  Eyes:     General: No scleral icterus.       Right eye: No discharge.        Left eye: No discharge.     Conjunctiva/sclera: Conjunctivae normal.     Pupils: Pupils are equal, round, and reactive to light.  Neck:     Musculoskeletal: Normal range of motion and neck supple.     Thyroid: No thyromegaly.  Cardiovascular:     Rate and Rhythm: Normal rate and regular rhythm.     Heart sounds: Normal heart sounds. No murmur. No friction rub. No gallop.   Pulmonary:     Effort: Pulmonary effort is normal. No respiratory distress.     Breath sounds: Normal breath sounds. No  stridor. No wheezing or rales.  Musculoskeletal:     Comments: Right ankle with bilateral malleoli tenderness felt through short leg splint; splint does not have stirrups, only posterior, sensation intact, cap refill less than 2-second  Lymphadenopathy:     Cervical: No cervical adenopathy.  Skin:    General: Skin is warm and dry.     Coloration: Skin is not pale.     Findings: No rash.  Neurological:     Mental Status: He is alert.     Coordination: Coordination normal.      ED Treatments / Results  Labs (all labs ordered are listed, but only abnormal results are displayed) Labs Reviewed - No data to display  EKG None  Radiology Dg Ankle Complete Right  Result Date: 08/14/2019 CLINICAL DATA:  Subsequent encounter from 08/07/19. 33 y/o male with known fracture of calcaneus here w/ ankle pain today after slipping in bathroom last night. Pt complains of pain to medial and lateral aspect of ankle wrapped in  splint. EXAM: RIGHT ANKLE - COMPLETE 3+ VIEW COMPARISON:  08/07/2019 FINDINGS: The avulsion fracture noted from the calcaneus on the prior study is unchanged. No new fractures.  No bone lesions. Ankle joint normally spaced and aligned.  No arthropathic changes. Ankle is supported with a posterior fiberglass splint. IMPRESSION: 1. No new fractures. 2. No change in the lateral calcaneal avulsion fracture. 3. No ankle joint abnormality. Electronically Signed   By: Amie Portland M.D.   On: 08/14/2019 15:00    Procedures Procedures (including critical care time)  Medications Ordered in ED Medications - No data to display   Initial Impression / Assessment and Plan / ED Course  I have reviewed the triage vital signs and the nursing notes.  Pertinent labs & imaging results that were available during my care of the patient were reviewed by me and considered in my medical decision making (see chart for details).        Patient presenting for ongoing pain and worsening pain after slip in the bathroom last night.  Repeat x-ray shows no new fractures.  Will give refill of very short course of Vicodin and encouraged follow-up to orthopedics.  Also advised ibuprofen.  Elevation discussed.  Patient understands and agrees with plan.  Patient vital stable throughout ED course and discharged in satisfactory condition.  Final Clinical Impressions(s) / ED Diagnoses   Final diagnoses:  Encounter for medication refill    ED Discharge Orders         Ordered    HYDROcodone-acetaminophen (NORCO/VICODIN) 5-325 MG tablet  Every 6 hours PRN     08/14/19 1528    ibuprofen (ADVIL) 600 MG tablet  Every 6 hours PRN     08/14/19 [address] PA-C 08/14/19 1620    Maia Plan, MD 08/14/19 1929

## 2020-10-22 IMAGING — DX DG FOOT COMPLETE 3+V*R*
3 series · 3 of 3 positions shown · non-contrast
Comparison: Right ankle radiograph dated 08/07/2019

CLINICAL DATA: 32-year-old male with fall and trauma to the right
foot.

EXAM:
RIGHT FOOT COMPLETE - 3+ VIEW

[foot ap]
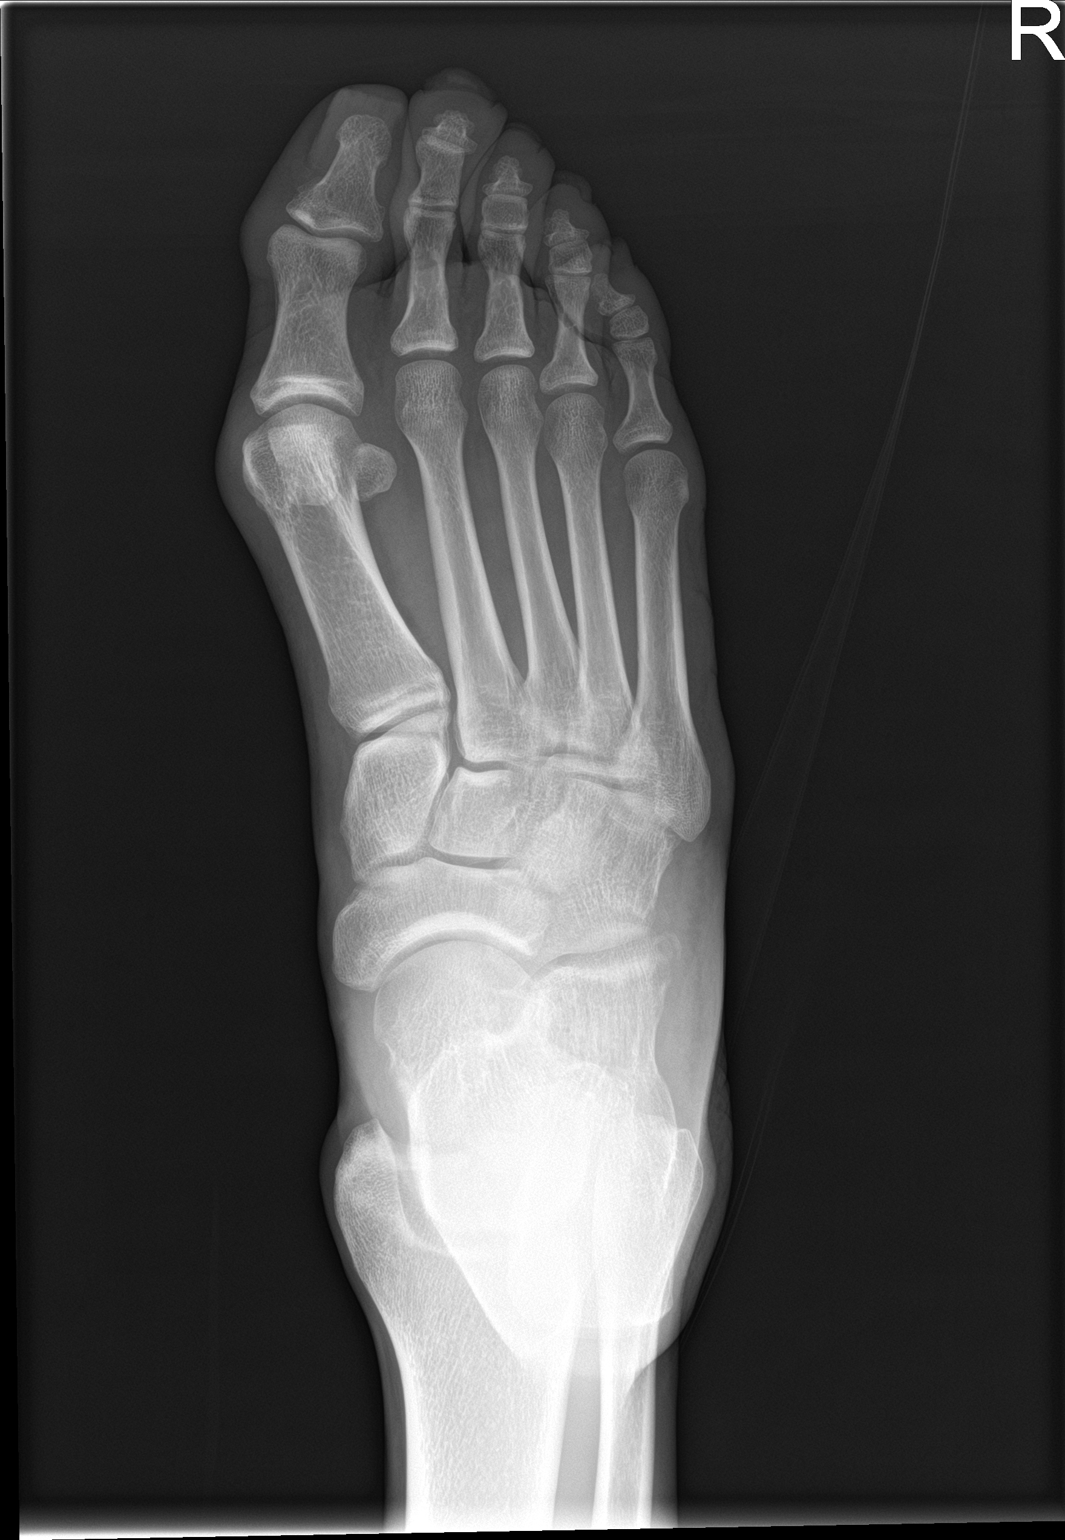

[foot obl]
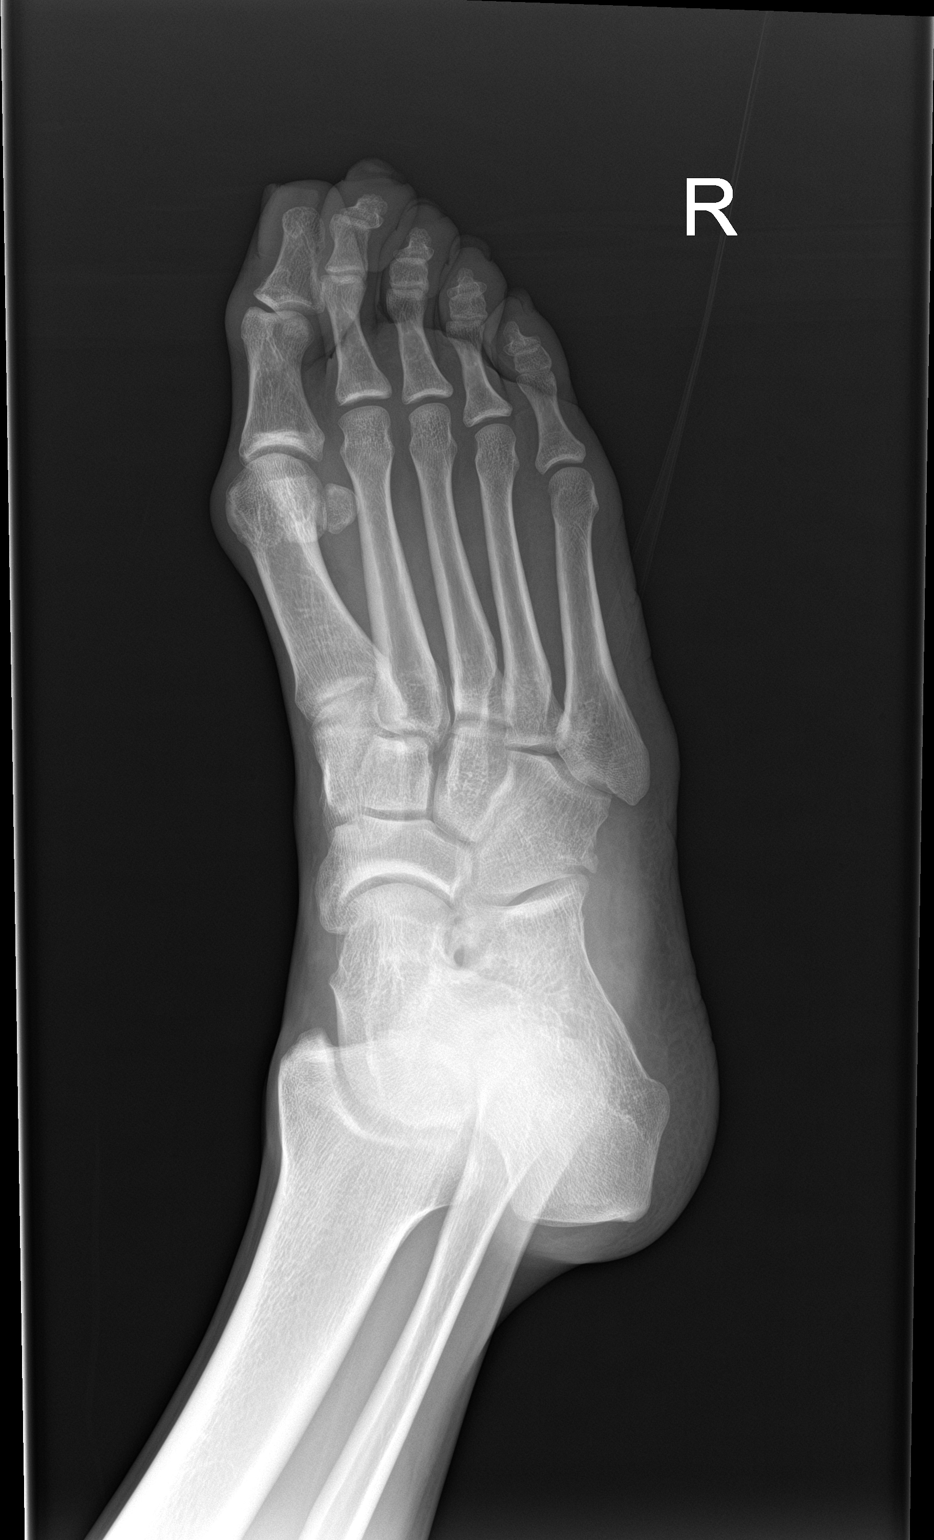

[foot lat]
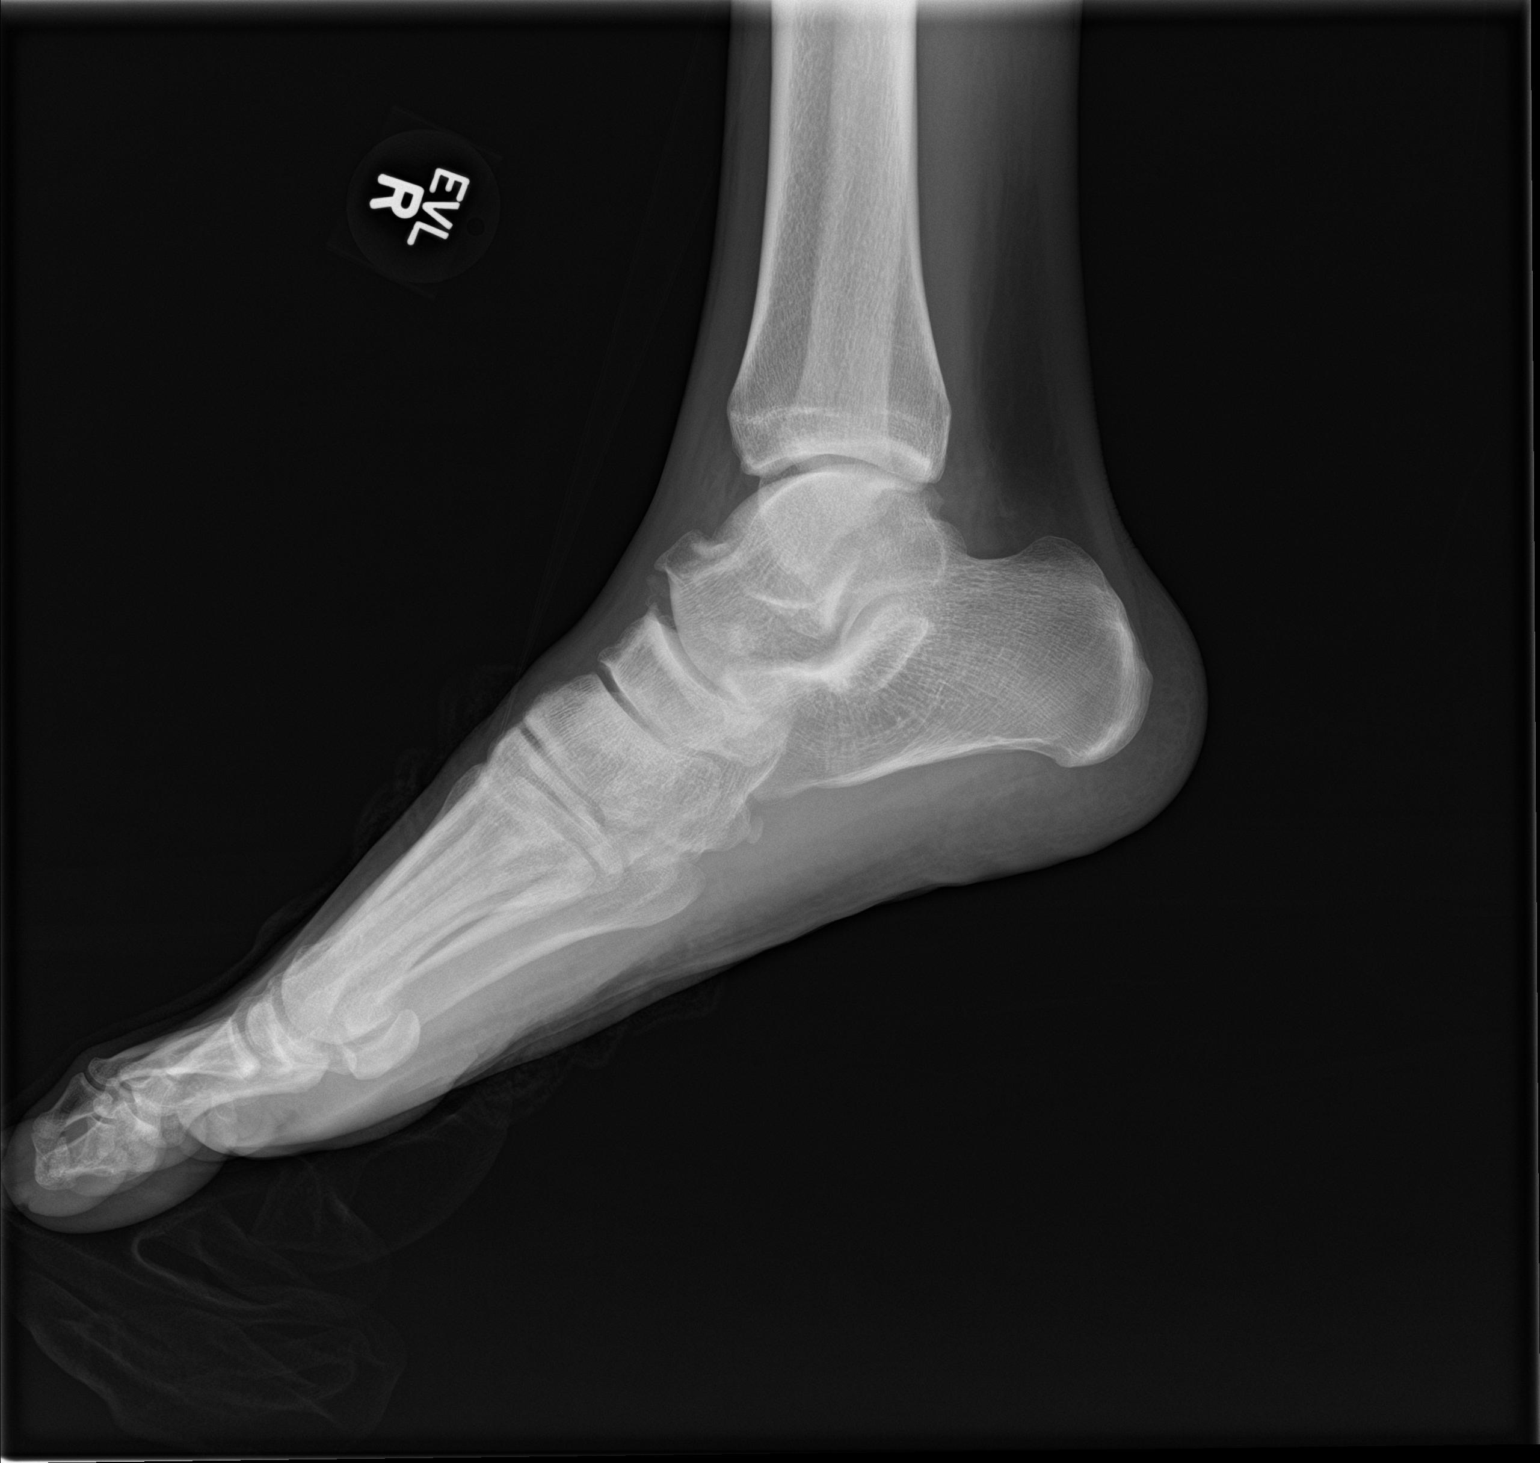

[3 of 3 positions shown; findings below may reference images not displayed]

FINDINGS: Small bone fragment along the lateral aspect of the hindfoot most
consistent with an avulsion fracture from the lateral anterior
corner of the calcaneus at the calcaneocuboid articulation. A small
avulsion injury of the corner of the cuboid may also be present.
There is no dislocation.

The bones are well mineralized. Mild hallux valgus.Mild degenerative
changes of the talonavicular joint. Soft tissue swelling of the
lateral hindfoot.
IMPRESSION: Avulsion injury of the lateral corner of the anterior calcaneus and
possible smaller avulsion injury from the lateral cortex of the
cuboid at the calcaneocuboid articulation.

## 2020-10-29 IMAGING — DX DG ANKLE COMPLETE 3+V*R*
3 series · 3 of 3 positions shown · non-contrast
Comparison: 08/07/2019

CLINICAL DATA: Subsequent encounter from 08/07/19. 32 y/o male with
known fracture of calcaneus here w/ ankle pain today after slipping
in bathroom last night. Pt complains of pain to medial and lateral
aspect of ankle wrapped in splint.

EXAM:
RIGHT ANKLE - COMPLETE 3+ VIEW

[ankle ap]
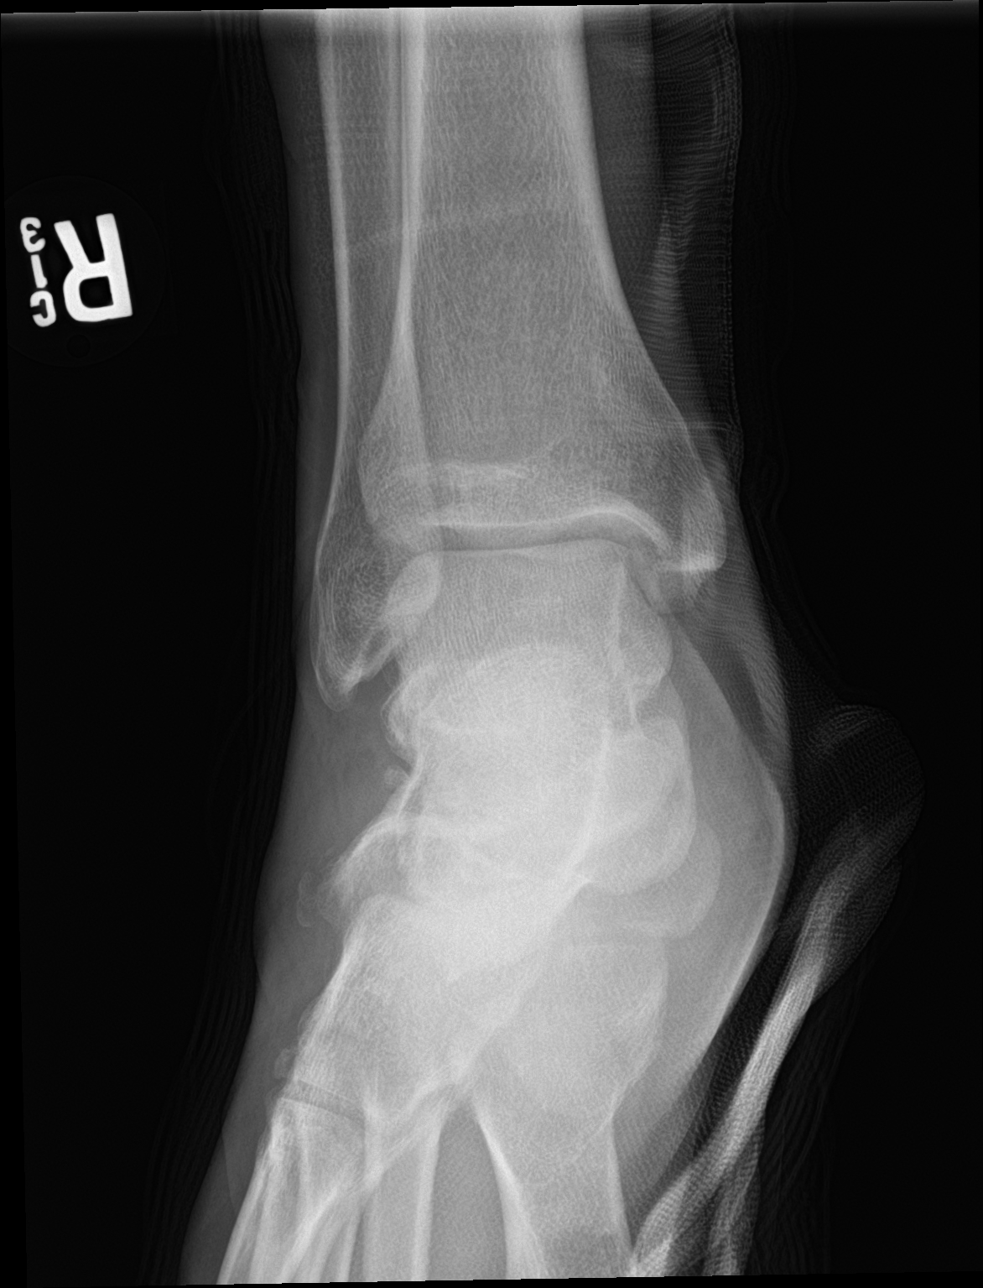

[ankle obl]
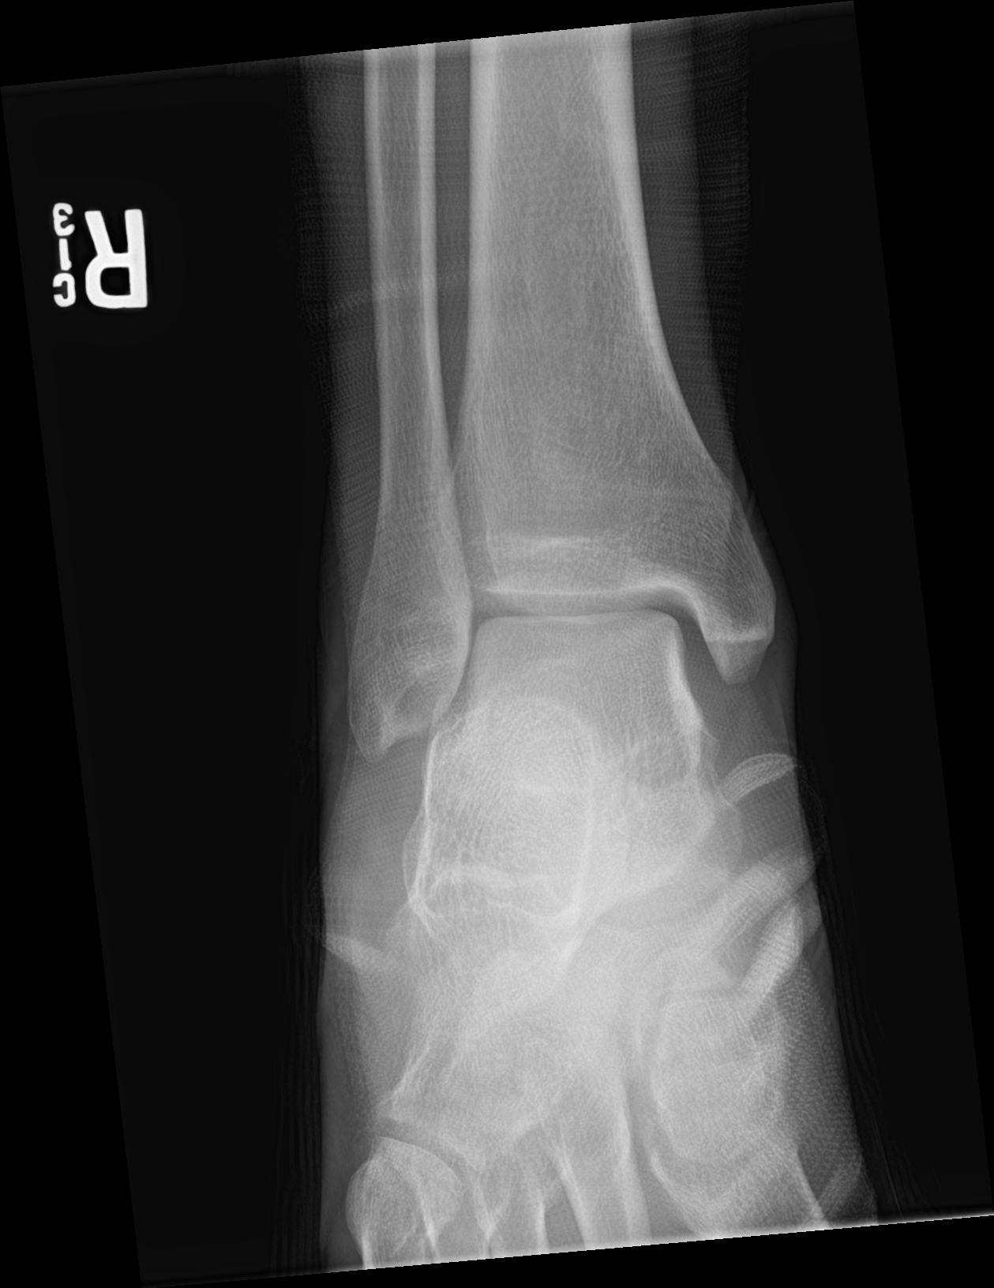

[ankle lat]
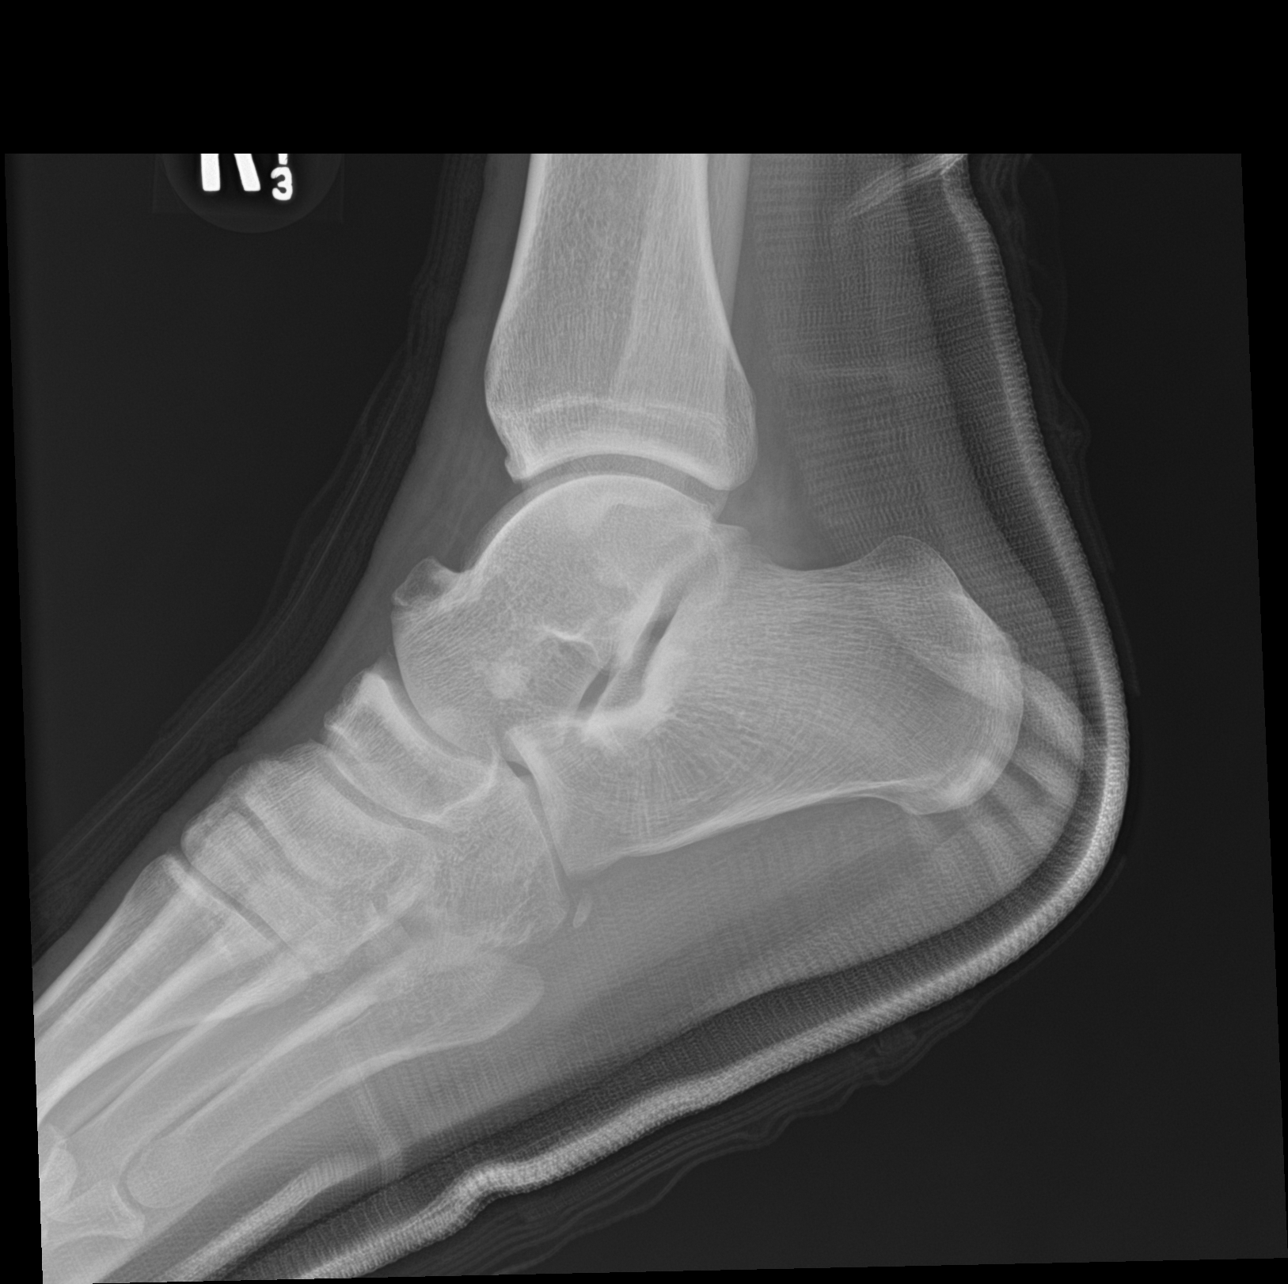

[3 of 3 positions shown; findings below may reference images not displayed]

FINDINGS: The avulsion fracture noted from the calcaneus on the prior study is
unchanged.

No new fractures.  No bone lesions.

Ankle joint normally spaced and aligned.  No arthropathic changes.

Ankle is supported with a posterior fiberglass splint.
IMPRESSION: 1. No new fractures.
2. No change in the lateral calcaneal avulsion fracture.
3. No ankle joint abnormality.

## 2022-03-11 ENCOUNTER — Other Ambulatory Visit: Payer: Self-pay

## 2022-03-11 ENCOUNTER — Emergency Department (HOSPITAL_COMMUNITY)
Admission: EM | Admit: 2022-03-11 | Discharge: 2022-03-11 | Disposition: A | Discharge status: Home / Self Care | Payer: Self-pay | Attending: Student | Admitting: Student

## 2022-03-11 DIAGNOSIS — S0990XA Unspecified injury of head, initial encounter: Secondary | ICD-10-CM

## 2022-03-11 DIAGNOSIS — Y92810 Car as the place of occurrence of the external cause: Secondary | ICD-10-CM | POA: Insufficient documentation

## 2022-03-11 DIAGNOSIS — Y9389 Activity, other specified: Secondary | ICD-10-CM | POA: Insufficient documentation

## 2022-03-11 DIAGNOSIS — W228XXA Striking against or struck by other objects, initial encounter: Secondary | ICD-10-CM | POA: Insufficient documentation

## 2022-03-11 DIAGNOSIS — S0001XA Abrasion of scalp, initial encounter: Secondary | ICD-10-CM | POA: Insufficient documentation

## 2022-03-11 NOTE — Discharge Instructions (Signed)
You can clean the wound with soap and water.  You can apply antibiotic ointment.  It should heal up fine without any additional treatment.

## 2022-03-11 NOTE — ED Triage Notes (Addendum)
Note made in error

## 2022-03-11 NOTE — ED Triage Notes (Signed)
Pt was working on his car and hit his head on it, has a small lac to top of R side of the head, bleeding controlled. Pt reported pain when it happened but now it has resolved. Pt wanted to make sure he didn't need stitches

## 2022-03-11 NOTE — ED Provider Notes (Signed)
  [facility] Emergency Department Provider Note MRN:  [id number]  Arrival date & time: 03/11/22     Chief Complaint   Head Laceration   History of Present Illness   Tommy Thompson is a 36 y.o. year-old male presents to the ED with chief complaint of head injury and wound to head.  States that he was working on his car tonight and hit his head on the suspension spring.  Denies any LOC, vomiting, seizure or weakness.  Last tdap 2-3 years ago per patient.  History provided by patient.   Review of Systems  Pertinent review of systems noted in HPI.    Physical Exam   Vitals:   03/11/22 2301  BP: 111/71  Pulse: 71  Resp: 18  Temp: 98.1 F (36.7 C)  SpO2: 93%    CONSTITUTIONAL:  -appearing, NAD NEURO:  Alert and oriented x 3, CN 3-12 grossly intact EYES:  eyes equal and reactive ENT/NECK:  Supple, no stridor  CARDIO:  appears well-perfused  PULM:  No respiratory distress,  GI/GU:  non-distended,  MSK/SPINE:  No gross deformities, no edema, moves all extremities  SKIN:  no rash, minor scalp abrasion, but no laceration requiring repair   *Additional and/or pertinent findings included in MDM below  Diagnostic and Interventional Summary    EKG Interpretation  Date/Time:    Ventricular Rate:    PR Interval:    QRS Duration:   QT Interval:    QTC Calculation:   R Axis:     Text Interpretation:         Labs Reviewed - No data to display  No orders to display    Medications - No data to display   Procedures  /  Critical Care Procedures  ED Course and Medical Decision Making  I have reviewed the triage vital signs, the nursing notes, and pertinent available records from the EMR.  Social Determinants Affecting Complexity of Care: Patient has no clinically significant social determinants affecting this chief complaint..   ED Course:   Patient here with head injury. Medical Decision Making Here with minor head injury.  Mild abrasion  not requiring repair.  Tdap current. Clears [place] CT rules.  Problems Addressed: Abrasion of scalp, initial encounter: self-limited or minor problem Injury of head, initial encounter: self-limited or minor problem     Consultants: No consultations were needed in caring for this patient.   Treatment and Plan: Emergency department workup does not suggest an emergent condition requiring admission or immediate intervention beyond  what has been performed at this time. The patient is safe for discharge and has  been instructed to return immediately for worsening symptoms, change in  symptoms or any other concerns    Final Clinical Impressions(s) / ED Diagnoses     ICD-10-CM   1. Injury of head, initial encounter  S09.90XA     2. Abrasion of scalp, initial encounter  [email or web address]       ED Discharge Orders     None         Discharge Instructions Discussed with and Provided to Patient:    Discharge Instructions      You can clean the wound with soap and water.  You can apply antibiotic ointment.  It should heal up fine without any additional treatment.      Montine Circle, PA-C 03/11/22 2312    Teressa Lower, MD 03/12/22 [phone]

## 2022-05-13 ENCOUNTER — Emergency Department (HOSPITAL_COMMUNITY)
Admission: EM | Admit: 2022-05-13 | Discharge: 2022-05-14 | Disposition: A | Discharge status: Home / Self Care | Payer: Self-pay | Attending: Emergency Medicine | Admitting: Emergency Medicine

## 2022-05-13 DIAGNOSIS — R0981 Nasal congestion: Secondary | ICD-10-CM | POA: Insufficient documentation

## 2022-05-13 DIAGNOSIS — R5383 Other fatigue: Secondary | ICD-10-CM | POA: Insufficient documentation

## 2022-05-13 DIAGNOSIS — R059 Cough, unspecified: Secondary | ICD-10-CM | POA: Insufficient documentation

## 2022-05-13 DIAGNOSIS — M791 Myalgia, unspecified site: Secondary | ICD-10-CM | POA: Insufficient documentation

## 2022-05-13 DIAGNOSIS — R6889 Other general symptoms and signs: Secondary | ICD-10-CM

## 2022-05-13 DIAGNOSIS — Z20822 Contact with and (suspected) exposure to covid-19: Secondary | ICD-10-CM | POA: Insufficient documentation

## 2022-05-13 DIAGNOSIS — J029 Acute pharyngitis, unspecified: Secondary | ICD-10-CM | POA: Insufficient documentation

## 2022-05-14 ENCOUNTER — Encounter (HOSPITAL_COMMUNITY): Payer: Self-pay

## 2022-05-14 ENCOUNTER — Other Ambulatory Visit: Payer: Self-pay

## 2022-05-14 LAB — COMPREHENSIVE METABOLIC PANEL
ALT: 45 U/L — ABNORMAL HIGH (ref 0–44)
AST: 30 U/L (ref 15–41)
Albumin: 4.2 g/dL (ref 3.5–5.0)
Alkaline Phosphatase: 55 U/L (ref 38–126)
Anion gap: 7 (ref 5–15)
BUN: 8 mg/dL (ref 6–20)
CO2: 27 mmol/L (ref 22–32)
Calcium: 9.3 mg/dL (ref 8.9–10.3)
Chloride: 105 mmol/L (ref 98–111)
Creatinine, Ser: 1.14 mg/dL (ref 0.61–1.24)
GFR, Estimated: >60 mL/min (ref >60)
Glucose, Bld: 101 mg/dL — ABNORMAL HIGH (ref 70–99)
Potassium: 3.4 mmol/L — ABNORMAL LOW (ref 3.5–5.1)
Sodium: 139 mmol/L (ref 135–145)
Total Bilirubin: 0.7 mg/dL (ref 0.3–1.2)
Total Protein: 7.1 g/dL (ref 6.5–8.1)

## 2022-05-14 LAB — CBC WITH DIFFERENTIAL/PLATELET
Abs Immature Granulocytes: 0.01 10*3/uL (ref 0.00–0.07)
Basophils Absolute: 0 10*3/uL (ref 0.0–0.1)
Basophils Relative: 1 %
Eosinophils Absolute: 0.1 10*3/uL (ref 0.0–0.5)
Eosinophils Relative: 1 %
HCT: 39.9 % (ref 39.0–52.0)
Hemoglobin: 13.9 g/dL (ref 13.0–17.0)
Immature Granulocytes: 0 %
Lymphocytes Relative: 53 %
Lymphs Abs: 3.1 10*3/uL (ref 0.7–4.0)
MCH: 29.6 pg (ref 26.0–34.0)
MCHC: 34.8 g/dL (ref 30.0–36.0)
MCV: 84.9 fL (ref 80.0–100.0)
Monocytes Absolute: 0.5 10*3/uL (ref 0.1–1.0)
Monocytes Relative: 9 %
Neutro Abs: 2.1 10*3/uL (ref 1.7–7.7)
Neutrophils Relative %: 36 %
Platelets: 205 10*3/uL (ref 150–400)
RBC: 4.7 MIL/uL (ref 4.22–5.81)
RDW: 12 % (ref 11.5–15.5)
WBC: 5.9 10*3/uL (ref 4.0–10.5)
nRBC: 0 % (ref 0.0–0.2)

## 2022-05-14 LAB — URINALYSIS, ROUTINE W REFLEX MICROSCOPIC
Bilirubin Urine: NEGATIVE
Glucose, UA: NEGATIVE mg/dL
Hgb urine dipstick: NEGATIVE
Ketones, ur: NEGATIVE mg/dL
Leukocytes,Ua: NEGATIVE
Nitrite: NEGATIVE
Protein, ur: NEGATIVE mg/dL
Specific Gravity, Urine: 1.002 — ABNORMAL LOW (ref 1.005–1.030)
pH: 6 (ref 5.0–8.0)

## 2022-05-14 LAB — CK: Total CK: 195 U/L (ref 49–397)

## 2022-05-14 LAB — CBG MONITORING, ED: Glucose-Capillary: 85 mg/dL (ref 70–99)

## 2022-05-14 LAB — SARS CORONAVIRUS 2 BY RT PCR: SARS Coronavirus 2 by RT PCR: NEGATIVE

## 2022-05-14 NOTE — ED Triage Notes (Signed)
Reports generalized weakness, body aches, mild shob x 3 days.

## 2022-05-14 NOTE — ED Provider Notes (Signed)
[facility] EMERGENCY DEPARTMENT Provider Note   CSN: [id number] Arrival date & time: 05/13/22  2329     History  Chief Complaint  Patient presents with   Generalized Body Aches    Tommy Thompson is a 36 y.o. male presenting to the emergency department with body aches.  Patient reports 3 to 4 days of body aches, runny nose, fatigue.  He reports mild sore throat, cough.  He denies any chest pain, back pain, abdominal pain, nausea, vomiting, diarrhea, shortness of breath, syncope, fevers or chills.  Denies sick contacts.  Has not tried anything for symptoms at home.  HPI     Home Medications Prior to Admission medications   Medication Sig Start Date End Date Taking? Authorizing Provider  HYDROcodone-acetaminophen (NORCO/VICODIN) 5-325 MG tablet Take 1 tablet by mouth every 6 (six) hours as needed for moderate pain. 08/14/19   Long, Arlyss Repress, MD  ibuprofen (ADVIL) 600 MG tablet Take 1 tablet (600 mg total) by mouth every 6 (six) hours as needed. 08/14/19   Long, Arlyss Repress, MD      Allergies    Patient has no known allergies.    Review of Systems   Review of Systems See HPI Physical Exam Updated Vital Signs BP 122/81 (BP Location: Right Arm)   Pulse 68   Temp 97.9 F (36.6 C) (Oral)   Resp 16   Ht 5\' 11"  (1.803 m)   Wt 77.1 kg   SpO2 98%   BMI 23.71 kg/m  Physical Exam Vitals and nursing note reviewed.  Constitutional:      General: He is not in acute distress.    Appearance: Normal appearance.  HENT:     Head:     Comments: Uvula midline, no significant posterior oropharyngeal erythema, no tonsillar swelling or exudate    Mouth/Throat:     Mouth: Mucous membranes are moist.  Cardiovascular:     Rate and Rhythm: Normal rate and regular rhythm.  Pulmonary:     Effort: Pulmonary effort is normal. No respiratory distress.     Breath sounds: Normal breath sounds.  Abdominal:     General: Abdomen is flat.     Palpations: Abdomen is soft.      Tenderness: There is no abdominal tenderness.  Skin:    General: Skin is warm and dry.     Capillary Refill: Capillary refill takes less than 2 seconds.  Neurological:     Mental Status: He is alert and oriented to person, place, and time. Mental status is at baseline.  Psychiatric:        Mood and Affect: Mood normal.        Behavior: Behavior normal.     ED Results / Procedures / Treatments   Labs (all labs ordered are listed, but only abnormal results are displayed) Labs Reviewed  COMPREHENSIVE METABOLIC PANEL - Abnormal; Notable for the following components:      Result Value   Potassium 3.4 (*)    Glucose, Bld 101 (*)    ALT 45 (*)    All other components within normal limits  URINALYSIS, ROUTINE W REFLEX MICROSCOPIC - Abnormal; Notable for the following components:   Color, Urine STRAW (*)    Specific Gravity, Urine 1.002 (*)    All other components within normal limits  SARS CORONAVIRUS 2 BY RT PCR  CBC WITH DIFFERENTIAL/PLATELET  CK  CBG MONITORING, ED    EKG None  Radiology No results found.  Procedures Procedures  Medications Ordered in ED Medications - No data to display  ED Course/ Medical Decision Making/ A&P Clinical Course as of 05/14/22 1318  Thu May 14, 2022  1223 CK Total: 195   [WS]    Clinical Course User Index [WS] Lonell Grandchild, MD                           Medical Decision Making Amount and/or Complexity of Data Reviewed Labs:  Decision-making details documented in ED Course.   36 year old male presenting to the emergency department with body aches.  Patient well-appearing, given constellation of symptoms, suspect viral upper respiratory tract infection.  Exam overall reassuring without focal findings.  CK negative doubt rhabdomyolysis.  Doubt bacterial illness with no focal symptoms concerning for this and reassuring exam         Final Clinical Impression(s) / ED Diagnoses Final diagnoses:  Flu-like symptoms     Rx / DC Orders ED Discharge Orders     None         Lonell Grandchild, MD 05/14/22 1321

## 2022-05-14 NOTE — ED Provider Triage Note (Signed)
  Emergency Medicine Provider Triage Evaluation Note  MRN:  [id number]  Arrival date & time: 05/14/22    Medically screening exam initiated at 12:10 AM.   CC:   Generalized Body Aches   HPI:  Tommy Thompson is a 36 y.o. year-old male presents to the ED with chief complaint of generalized body aches.  Reports sore muscles.  Works for [employer]  Denies fever.  Reports slight cough.  History provided by patient. ROS:  -As included in HPI PE:   Vitals:   05/13/22 2358  BP: 111/76  Pulse: 68  Resp: 16  Temp: 98.3 F (36.8 C)  SpO2: 99%    Non-toxic appearing No respiratory distress  MDM:  Based on signs and symptoms, covid is highest on my differential. I've ordered labs and covid in triage to expedite lab/diagnostic workup.  Patient was informed that the remainder of the evaluation will be completed by another provider, this initial triage assessment does not replace that evaluation, and the importance of remaining in the ED until their evaluation is complete.    Roxy Horseman, PA-C 05/14/22 0011

## 2022-09-23 ENCOUNTER — Encounter (HOSPITAL_COMMUNITY): Payer: Self-pay | Admitting: *Deleted

## 2022-09-23 ENCOUNTER — Emergency Department (HOSPITAL_COMMUNITY)
Admission: EM | Admit: 2022-09-23 | Discharge: 2022-09-23 | Disposition: A | Discharge status: Home / Self Care | Payer: Self-pay | Attending: Emergency Medicine | Admitting: Emergency Medicine

## 2022-09-23 ENCOUNTER — Emergency Department (HOSPITAL_COMMUNITY): Payer: Self-pay

## 2022-09-23 ENCOUNTER — Other Ambulatory Visit: Payer: Self-pay

## 2022-09-23 DIAGNOSIS — J45909 Unspecified asthma, uncomplicated: Secondary | ICD-10-CM | POA: Insufficient documentation

## 2022-09-23 DIAGNOSIS — M791 Myalgia, unspecified site: Secondary | ICD-10-CM | POA: Diagnosis not present

## 2022-09-23 DIAGNOSIS — Y92410 Unspecified street and highway as the place of occurrence of the external cause: Secondary | ICD-10-CM | POA: Diagnosis not present

## 2022-09-23 DIAGNOSIS — M7918 Myalgia, other site: Secondary | ICD-10-CM

## 2022-09-23 DIAGNOSIS — M545 Low back pain, unspecified: Secondary | ICD-10-CM | POA: Insufficient documentation

## 2022-09-23 DIAGNOSIS — M546 Pain in thoracic spine: Secondary | ICD-10-CM | POA: Diagnosis present

## 2022-09-23 MED ORDER — NAPROXEN 500 MG PO TABS: 500.0000 mg | ORAL_TABLET | Freq: Two times a day (BID) | ORAL | 0 refills | Status: AC
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
## 2022-09-23 NOTE — Discharge Instructions (Addendum)
Your imaging studies were reassuring today.  Please take your medications as prescribed. Please take tylenol/ibuprofen for pain.  You can also use ice/heat packs. I recommend close follow-up with PCP for reevaluation.  Please do not hesitate to return to emergency department if worrisome signs symptoms we discussed become apparent.
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
## 2022-10-28 ENCOUNTER — Other Ambulatory Visit: Payer: Self-pay

## 2022-10-28 ENCOUNTER — Emergency Department (HOSPITAL_COMMUNITY)
Admission: EM | Admit: 2022-10-28 | Discharge: 2022-10-29 | Discharge status: Against Medical Advice | Payer: Self-pay | Attending: Emergency Medicine | Admitting: Emergency Medicine

## 2022-10-28 ENCOUNTER — Encounter (HOSPITAL_COMMUNITY): Payer: Self-pay

## 2022-10-28 DIAGNOSIS — S161XXA Strain of muscle, fascia and tendon at neck level, initial encounter: Secondary | ICD-10-CM | POA: Insufficient documentation

## 2022-10-28 DIAGNOSIS — J45909 Unspecified asthma, uncomplicated: Secondary | ICD-10-CM | POA: Diagnosis not present

## 2022-10-28 DIAGNOSIS — Y92410 Unspecified street and highway as the place of occurrence of the external cause: Secondary | ICD-10-CM | POA: Insufficient documentation

## 2022-10-28 DIAGNOSIS — Z5321 Procedure and treatment not carried out due to patient leaving prior to being seen by health care provider: Secondary | ICD-10-CM | POA: Diagnosis not present

## 2022-10-28 DIAGNOSIS — M545 Low back pain, unspecified: Secondary | ICD-10-CM | POA: Insufficient documentation

## 2022-10-28 MED ORDER — IBUPROFEN 800 MG PO TABS
800.0000 mg | ORAL_TABLET | Freq: Once | ORAL | Status: AC
Start: 2022-10-28 — End: 2022-10-28
  Administered 2022-10-28: 800 mg via ORAL
  Filled 2022-10-28: qty 1
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
## 2022-10-29 ENCOUNTER — Emergency Department (HOSPITAL_COMMUNITY): Payer: Self-pay

## 2022-10-29 ENCOUNTER — Encounter (HOSPITAL_COMMUNITY): Payer: Self-pay | Admitting: Emergency Medicine

## 2022-10-29 ENCOUNTER — Emergency Department (HOSPITAL_COMMUNITY)
Admission: EM | Admit: 2022-10-29 | Discharge: 2022-10-29 | Disposition: A | Discharge status: Home / Self Care | Payer: Self-pay | Source: Home / Self Care | Attending: Emergency Medicine | Admitting: Emergency Medicine

## 2022-10-29 DIAGNOSIS — Y92410 Unspecified street and highway as the place of occurrence of the external cause: Secondary | ICD-10-CM | POA: Insufficient documentation

## 2022-10-29 DIAGNOSIS — J45909 Unspecified asthma, uncomplicated: Secondary | ICD-10-CM | POA: Insufficient documentation

## 2022-10-29 DIAGNOSIS — S161XXA Strain of muscle, fascia and tendon at neck level, initial encounter: Secondary | ICD-10-CM | POA: Insufficient documentation

## 2022-10-29 MED ORDER — IBUPROFEN 600 MG PO TABS: 600.0000 mg | ORAL_TABLET | Freq: Four times a day (QID) | ORAL | 0 refills | Status: DC | PRN

## 2022-10-29 MED ORDER — CYCLOBENZAPRINE HCL 10 MG PO TABS: 10.0000 mg | ORAL_TABLET | Freq: Two times a day (BID) | ORAL | 0 refills | Status: DC | PRN
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
## 2022-10-29 NOTE — ED Notes (Signed)
Pt droped off labels and wrist band and walked out.

## 2022-10-29 NOTE — Discharge Instructions (Signed)
You have been evaluated for your symptoms.  Fortunately CT scan of the cervical spine did not show any concerning finding.  You may take ibuprofen Flexeril as needed for aches and pain.  You may follow-up with orthopedic doctor for further care if needed.
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
## 2023-08-10 ENCOUNTER — Encounter (HOSPITAL_COMMUNITY): Payer: Self-pay | Admitting: Pharmacy Technician

## 2023-08-10 ENCOUNTER — Other Ambulatory Visit: Payer: Self-pay

## 2023-08-10 ENCOUNTER — Emergency Department (HOSPITAL_COMMUNITY)
Admission: EM | Admit: 2023-08-10 | Discharge: 2023-08-10 | Disposition: A | Discharge status: Home / Self Care | Payer: Self-pay | Attending: Emergency Medicine | Admitting: Emergency Medicine

## 2023-08-10 DIAGNOSIS — M25532 Pain in left wrist: Secondary | ICD-10-CM | POA: Insufficient documentation

## 2023-08-10 MED ORDER — GABAPENTIN 100 MG PO CAPS: 100.0000 mg | ORAL_CAPSULE | Freq: Three times a day (TID) | ORAL | 0 refills | Status: AC | PRN

## 2023-08-10 MED ORDER — KETOROLAC TROMETHAMINE 60 MG/2ML IM SOLN
60.0000 mg | Freq: Once | INTRAMUSCULAR | Status: AC
Start: 2023-08-10 — End: 2023-08-10
  Administered 2023-08-10: 60 mg via INTRAMUSCULAR
  Filled 2023-08-10: qty 2

## 2023-08-10 NOTE — ED Notes (Signed)
Ortho called for wrist brace 

## 2023-08-10 NOTE — Discharge Instructions (Addendum)
You wear the wrist brace at all times except when you are showering or bathing for the next 2 weeks.  This will help maintain a neutral position which help relieve stress on your wrist.  You can use ibuprofen 600 mg over-the-counter every 6 hours as needed for pain, as well as Tylenol 650 mg over-the-counter every 6 hours as needed for pain.  I prescribed a nerve medicine called gabapentin which she can also use "as needed" every 8 hours for pain.  This may or may not improve your pain.

## 2023-08-10 NOTE — Progress Notes (Signed)
Orthopedic Tech Progress Note Patient Details:  GIAN MUMPER 1986-05-23 [id number]  Ortho Devices Type of Ortho Device: Velcro wrist splint Ortho Device/Splint Location: RUE Ortho Device/Splint Interventions: Ordered, Application, Adjustment   Post Interventions Patient Tolerated: Well Instructions Provided: Care of device, Adjustment of device  Sherilyn Banker 08/10/2023, 2:25 PM

## 2023-08-10 NOTE — ED Notes (Deleted)
PT transported to MRI, refused PRN ativan

## 2023-08-10 NOTE — ED Triage Notes (Signed)
Pt here with reports of L wrist pain. Woke up from sleep with 10/10 pain and decreased ROM. Tool ibuprofen with some relief.

## 2023-08-10 NOTE — ED Provider Notes (Signed)
[facility] EMERGENCY DEPARTMENT AT [facility] Provider Note   CSN: [id number] Arrival date & time: 08/10/23  1210     History  Chief Complaint  Patient presents with   Wrist Pain    Tommy Thompson is a 37 y.o. male presenting to the ED complaining of left wrist pain.  Patient reports that he woke up this morning and was having pain and burning sensation in his left hand and pain in his left wrist.  He says he sleeps with his hands folded together under his pillow wonders whether he hyperextended his arm overnight.  But he denies any trauma or injury to the wrist or arm.  HPI     Home Medications Prior to Admission medications   Medication Sig Start Date End Date Taking? Authorizing Provider  gabapentin (NEURONTIN) 100 MG capsule Take 1 capsule (100 mg total) by mouth 3 (three) times daily as needed for up to 21 doses. 08/10/23  Yes Nyree Yonker, Kermit Balo, MD  cyclobenzaprine (FLEXERIL) 10 MG tablet Take 1 tablet (10 mg total) by mouth 2 (two) times daily as needed for muscle spasms. 10/29/22   Fayrene Helper, PA-C  HYDROcodone-acetaminophen (NORCO/VICODIN) 5-325 MG tablet Take 1 tablet by mouth every 6 (six) hours as needed for moderate pain. 08/14/19   Long, Arlyss Repress, MD  ibuprofen (ADVIL) 600 MG tablet Take 1 tablet (600 mg total) by mouth every 6 (six) hours as needed for headache or moderate pain. 10/29/22   Fayrene Helper, PA-C  naproxen (NAPROSYN) 500 MG tablet Take 1 tablet (500 mg total) by mouth 2 (two) times daily. 09/23/22   Jeanelle Malling, PA      Allergies    Patient has no known allergies.    Review of Systems   Review of Systems  Physical Exam Updated Vital Signs BP 103/72   Pulse 64   Temp 97.9 F (36.6 C) (Oral)   Resp 17   SpO2 97%  Physical Exam Constitutional:      General: He is not in acute distress. HENT:     Head: Normocephalic and atraumatic.  Eyes:     Conjunctiva/sclera: Conjunctivae normal.     Pupils: Pupils are equal, round, and reactive to  light.  Cardiovascular:     Rate and Rhythm: Normal rate and regular rhythm.  Pulmonary:     Effort: Pulmonary effort is normal. No respiratory distress.  Abdominal:     General: There is no distension.     Tenderness: There is no abdominal tenderness.  Skin:    General: Skin is warm and dry.  Neurological:     General: No focal deficit present.     Mental Status: He is alert and oriented to person, place, and time. Mental status is at baseline.  Psychiatric:        Mood and Affect: Mood normal.        Behavior: Behavior normal.     ED Results / Procedures / Treatments   Labs (all labs ordered are listed, but only abnormal results are displayed) Labs Reviewed - No data to display  EKG None  Radiology No results found.  Procedures Procedures    Medications Ordered in ED Medications  ketorolac (TORADOL) injection 60 mg (60 mg Intramuscular Given 08/10/23 1429)    ED Course/ Medical Decision Making/ A&P  Medical Decision Making  Patient presenting to ED with complaint of left wrist pain, atraumatic.  I strongly suspect likely related to peripheral neuropathy or carpal tunnel compression based on the way of sleeping overnight.  He does not have wrist drop or weakness of my exam.  There is no wrist swelling or evidence of infection or inflammation within the wrist joint.  No indication for x-ray imaging or blood test or arthrocentesis at this time.  He is clinically well-appearing.  I will place him in a wrist brace to maintain a neutral position.  He does not want steroid medications due to adverse reactions.  He can use NSAIDs and an occasional gabapentin at home.  He verbalized understanding and is stable for discharge        Final Clinical Impression(s) / ED Diagnoses Final diagnoses:  Left wrist pain    Rx / DC Orders ED Discharge Orders          Ordered    gabapentin (NEURONTIN) 100 MG capsule  3 times daily PRN         08/10/23 1451              Terald Sleeper, MD 08/10/23 1451

## 2023-12-31 ENCOUNTER — Encounter (HOSPITAL_BASED_OUTPATIENT_CLINIC_OR_DEPARTMENT_OTHER): Payer: Self-pay | Admitting: Emergency Medicine

## 2023-12-31 ENCOUNTER — Other Ambulatory Visit: Payer: Self-pay

## 2023-12-31 ENCOUNTER — Emergency Department (HOSPITAL_BASED_OUTPATIENT_CLINIC_OR_DEPARTMENT_OTHER): Payer: Self-pay | Admitting: Radiology

## 2023-12-31 DIAGNOSIS — J45909 Unspecified asthma, uncomplicated: Secondary | ICD-10-CM | POA: Insufficient documentation

## 2023-12-31 DIAGNOSIS — M545 Low back pain, unspecified: Secondary | ICD-10-CM | POA: Diagnosis present

## 2023-12-31 DIAGNOSIS — Y92410 Unspecified street and highway as the place of occurrence of the external cause: Secondary | ICD-10-CM | POA: Insufficient documentation
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
## 2024-01-01 ENCOUNTER — Emergency Department (HOSPITAL_BASED_OUTPATIENT_CLINIC_OR_DEPARTMENT_OTHER)
Admission: EM | Admit: 2024-01-01 | Discharge: 2024-01-01 | Disposition: A | Discharge status: Home / Self Care | Payer: Self-pay | Attending: Emergency Medicine | Admitting: Emergency Medicine

## 2024-01-01 DIAGNOSIS — M7918 Myalgia, other site: Secondary | ICD-10-CM

## 2024-01-01 MED ORDER — CYCLOBENZAPRINE HCL 5 MG PO TABS
5.0000 mg | ORAL_TABLET | Freq: Once | ORAL | Status: AC
  Administered 2024-01-01: 5 mg via ORAL
  Filled 2024-01-01: qty 1

## 2024-01-01 MED ORDER — IBUPROFEN 600 MG PO TABS: 600.0000 mg | ORAL_TABLET | Freq: Four times a day (QID) | ORAL | 0 refills | Status: AC | PRN

## 2024-01-01 MED ORDER — CYCLOBENZAPRINE HCL 5 MG PO TABS: 10.0000 mg | ORAL_TABLET | Freq: Two times a day (BID) | ORAL | 0 refills | Status: AC | PRN

## 2024-01-01 MED ORDER — KETOROLAC TROMETHAMINE 30 MG/ML IJ SOLN
30.0000 mg | Freq: Once | INTRAMUSCULAR | Status: AC
  Administered 2024-01-01: 30 mg via INTRAMUSCULAR
  Filled 2024-01-01: qty 1
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
